# Patient Record
Sex: Male | Born: 1950
Health system: Southern US, Community
[De-identification: ages and names within clinical notes are randomized; demographics above are authoritative.]

## PROBLEM LIST (undated history)

## (undated) ENCOUNTER — Emergency Department (HOSPITAL_COMMUNITY): Payer: BC Managed Care – PPO

## (undated) DIAGNOSIS — R059 Cough, unspecified: Secondary | ICD-10-CM

## (undated) DIAGNOSIS — J302 Other seasonal allergic rhinitis: Secondary | ICD-10-CM

## (undated) DIAGNOSIS — Z9889 Other specified postprocedural states: Secondary | ICD-10-CM

## (undated) DIAGNOSIS — H269 Unspecified cataract: Secondary | ICD-10-CM

## (undated) DIAGNOSIS — R05 Cough: Secondary | ICD-10-CM

## (undated) DIAGNOSIS — M1990 Unspecified osteoarthritis, unspecified site: Secondary | ICD-10-CM

## (undated) DIAGNOSIS — M2550 Pain in unspecified joint: Secondary | ICD-10-CM

## (undated) DIAGNOSIS — I10 Essential (primary) hypertension: Secondary | ICD-10-CM

## (undated) HISTORY — PX: ESOPHAGOGASTRODUODENOSCOPY: SHX1529

## (undated) HISTORY — PX: TONSILLECTOMY: SUR1361

## (undated) HISTORY — PX: COLONOSCOPY: SHX174

---

## 1998-04-09 ENCOUNTER — Ambulatory Visit (HOSPITAL_COMMUNITY): Admission: RE | Admit: 1998-04-09 | Discharge: 1998-04-09 | Payer: Self-pay | Admitting: Gastroenterology

## 1998-08-28 ENCOUNTER — Encounter: Admission: RE | Admit: 1998-08-28 | Discharge: 1998-11-26 | Payer: Self-pay | Admitting: Internal Medicine

## 2001-08-26 ENCOUNTER — Ambulatory Visit (HOSPITAL_COMMUNITY): Admission: RE | Admit: 2001-08-26 | Discharge: 2001-08-26 | Payer: Self-pay | Admitting: Gastroenterology

## 2001-08-26 ENCOUNTER — Encounter (INDEPENDENT_AMBULATORY_CARE_PROVIDER_SITE_OTHER): Payer: Self-pay | Admitting: Specialist

## 2009-07-11 ENCOUNTER — Ambulatory Visit (HOSPITAL_COMMUNITY): Admission: RE | Admit: 2009-07-11 | Discharge: 2009-07-11 | Payer: Self-pay | Admitting: Internal Medicine

## 2010-06-20 ENCOUNTER — Ambulatory Visit: Payer: BC Managed Care – PPO | Attending: Orthopedic Surgery

## 2010-06-20 DIAGNOSIS — M25519 Pain in unspecified shoulder: Secondary | ICD-10-CM | POA: Insufficient documentation

## 2010-06-20 DIAGNOSIS — R5381 Other malaise: Secondary | ICD-10-CM | POA: Insufficient documentation

## 2010-06-20 DIAGNOSIS — IMO0001 Reserved for inherently not codable concepts without codable children: Secondary | ICD-10-CM | POA: Insufficient documentation

## 2010-06-20 DIAGNOSIS — M25619 Stiffness of unspecified shoulder, not elsewhere classified: Secondary | ICD-10-CM | POA: Insufficient documentation

## 2010-06-22 ENCOUNTER — Encounter: Payer: Self-pay | Admitting: *Deleted

## 2010-07-03 ENCOUNTER — Ambulatory Visit: Payer: BC Managed Care – PPO

## 2010-07-10 ENCOUNTER — Ambulatory Visit: Payer: BC Managed Care – PPO

## 2010-07-24 ENCOUNTER — Ambulatory Visit: Payer: BC Managed Care – PPO

## 2010-08-02 NOTE — Procedures (Signed)
[place]. [facility]  Patient:    Kevin Reynolds, Kevin Reynolds Visit Number: [id number] MRN: [id number]          Service Type: END Location: ENDO Attending Physician:  Rich Brave Dictated by:   Florencia Reasons, M.D. Proc. Date: 08/26/01 Admit Date:  08/26/2001 Discharge Date: 08/26/2001   CC:         Lindell Spar. Chestine Spore, M.D.   Procedure Report  PROCEDURE:  Colonoscopy with biopsy.  INDICATION:  A 60 year old gentleman with prior history of colonic adenomas, removed approximately seven years ago and again about 3-1/2 years ago.  FINDINGS:  Diminutive polyp in the distal rectum.  DESCRIPTION OF PROCEDURE:  The nature, purpose, and risks of the procedure were familiar to the patient from prior exams, and he provided written consent.  Sedation was fentanyl 100 mcg and Versed 8 mg IV without arrhythmias or desaturation.  Due to the patients stocky, muscular body habitus, even with sedation it was difficult to get a good perianal or digital exam of the prostate, but the small portion of the prostate gland I was able to feel felt normal.  The Olympus video adult colonoscope was advanced to the cecum without difficulty, and pullback was then performed.  The quality of the prep was excellent, and it was felt that all areas were well-seen.  The only abnormality on this exam was a 2-3 mm sessile polyp in the distal rectum just proximal to the dentate line, observed on retroflex viewing and removed by a couple of cold biopsies.  No large polyps, cancer, colitis, vascular malformations, or diverticular disease were observed.  The patient tolerated the procedure well, and there were no apparent complications.  IMPRESSION:  Normal exam except for diminutive distal rectal polyp.  PLAN:  Await pathology.  Anticipate colonoscopic follow-up in five years. Dictated by:   Florencia Reasons, M.D. Attending Physician:  Rich Brave DD:   08/26/01 TD:  08/28/01 Job: [id number]

## 2012-06-04 ENCOUNTER — Other Ambulatory Visit: Payer: Self-pay | Admitting: Ophthalmology

## 2012-06-04 NOTE — H&P (Signed)
  Pre-operative History and Physical for Ophthalmic Surgery  Kevin Reynolds 06/04/2012                  Chief Complaint:  Decreased vision and glare for driving OS  Diagnosis: Combined Cataract OS  Allergies not on file   Prior to Admission medications   Not on File    Planned Procedure:                                       Phacoemulsification, Posterior Chamber Intra-ocular Lens Left Eye                                       Acrysof MA50BM + 10.00  Diopter PC IOL for implant OS   There were no vitals filed for this visit.  Pulse: 68         Temp: NE        Resp:  16       ROS: negative  No past medical history on file.  No past surgical history on file.   History   Social History  . Marital Status: Married    Spouse Name: N/A    Number of Children: N/A  . Years of Education: N/A   Occupational History  . Not on file.   Social History Main Topics  . Smoking status: Not on file  . Smokeless tobacco: Not on file  . Alcohol Use: Not on file  . Drug Use: Not on file  . Sexually Active: Not on file   Other Topics Concern  . Not on file   Social History Narrative  . No narrative on file     The following examination is for anesthesia clearance for minimally invasive Ophthalmic surgery. It is primarily to document heart and lung findings and is not intended to elucidate unknown general medical conditions inclusive of abdominal masses, lung lesions, etc.   General Constitution:  .wml   Alertness/Orientation:  Person, time place     yes   HEENT:  Eye Findings:  Combined Cataract                   left eye  Neck: supple without masses  Chest/Lungs: clear to auscultation  Cardiac: Normal S1 and S2 without Murmur, S3 or S4  Neuro: non-focal  Impression:  Combined Cataract Left Eye  Planned Procedure:  Phacoemulsification, Posterior Chamber Intraocular Lens     Shade Flood, MD

## 2012-06-30 ENCOUNTER — Encounter (HOSPITAL_COMMUNITY): Payer: Self-pay | Admitting: Pharmacy Technician

## 2012-07-01 ENCOUNTER — Encounter (HOSPITAL_COMMUNITY)
Admission: RE | Admit: 2012-07-01 | Discharge: 2012-07-01 | Disposition: A | Discharge status: Home / Self Care | Payer: BC Managed Care – PPO | Source: Ambulatory Visit | Attending: Ophthalmology | Admitting: Ophthalmology

## 2012-07-01 ENCOUNTER — Encounter (HOSPITAL_COMMUNITY)
Admission: RE | Admit: 2012-07-01 | Discharge: 2012-07-01 | Disposition: A | Discharge status: Home / Self Care | Payer: BC Managed Care – PPO | Source: Ambulatory Visit | Attending: Anesthesiology | Admitting: Anesthesiology

## 2012-07-01 ENCOUNTER — Encounter (HOSPITAL_COMMUNITY): Payer: Self-pay

## 2012-07-01 DIAGNOSIS — Z01818 Encounter for other preprocedural examination: Secondary | ICD-10-CM | POA: Insufficient documentation

## 2012-07-01 DIAGNOSIS — H269 Unspecified cataract: Secondary | ICD-10-CM | POA: Insufficient documentation

## 2012-07-01 DIAGNOSIS — J309 Allergic rhinitis, unspecified: Secondary | ICD-10-CM | POA: Insufficient documentation

## 2012-07-01 DIAGNOSIS — I10 Essential (primary) hypertension: Secondary | ICD-10-CM | POA: Insufficient documentation

## 2012-07-01 DIAGNOSIS — M129 Arthropathy, unspecified: Secondary | ICD-10-CM | POA: Insufficient documentation

## 2012-07-01 DIAGNOSIS — Z01812 Encounter for preprocedural laboratory examination: Secondary | ICD-10-CM | POA: Insufficient documentation

## 2012-07-01 DIAGNOSIS — G4733 Obstructive sleep apnea (adult) (pediatric): Secondary | ICD-10-CM | POA: Insufficient documentation

## 2012-07-01 DIAGNOSIS — Z6841 Body Mass Index (BMI) 40.0 and over, adult: Secondary | ICD-10-CM | POA: Insufficient documentation

## 2012-07-01 DIAGNOSIS — Z87891 Personal history of nicotine dependence: Secondary | ICD-10-CM | POA: Insufficient documentation

## 2012-07-01 DIAGNOSIS — Z01810 Encounter for preprocedural cardiovascular examination: Secondary | ICD-10-CM | POA: Insufficient documentation

## 2012-07-01 DIAGNOSIS — I498 Other specified cardiac arrhythmias: Secondary | ICD-10-CM | POA: Insufficient documentation

## 2012-07-01 HISTORY — DX: Pain in unspecified joint: M25.50

## 2012-07-01 HISTORY — DX: Essential (primary) hypertension: I10

## 2012-07-01 HISTORY — DX: Other specified postprocedural states: Z98.890

## 2012-07-01 HISTORY — DX: Unspecified cataract: H26.9

## 2012-07-01 HISTORY — DX: Cough: R05

## 2012-07-01 HISTORY — DX: Cough, unspecified: R05.9

## 2012-07-01 HISTORY — DX: Other seasonal allergic rhinitis: J30.2

## 2012-07-01 HISTORY — DX: Unspecified osteoarthritis, unspecified site: M19.90

## 2012-07-01 LAB — BASIC METABOLIC PANEL
BUN: 18 mg/dL (ref 6–23)
Calcium: 9.5 mg/dL (ref 8.4–10.5)
Creatinine, Ser: 1.27 mg/dL (ref 0.50–1.35)
GFR calc Af Amer: 70 mL/min — ABNORMAL LOW (ref >90)
GFR calc non Af Amer: 60 mL/min — ABNORMAL LOW (ref >90)
Glucose, Bld: 83 mg/dL (ref 70–99)
Potassium: 3.9 mEq/L (ref 3.5–5.1)

## 2012-07-01 LAB — CBC
MCH: 30.2 pg (ref 26.0–34.0)
MCHC: 36 g/dL (ref 30.0–36.0)
Platelets: 220 10*3/uL (ref 150–400)
RDW: 13.2 % (ref 11.5–15.5)

## 2012-07-01 NOTE — Progress Notes (Signed)
Pt doesn't have a cardiologist  Denies ever having an echo/stress test /heart cath  Medical MD with Dr.Preston Chestine Spore  Denies EKG or CXR in past yr

## 2012-07-01 NOTE — Pre-Procedure Instructions (Signed)
Kevin Reynolds  07/01/2012   Your procedure is scheduled on:  Wed, April 30 @ 8:30 AM  Report to [facility] at 6:30 AM.  Call this number if you have problems the morning of surgery: [phone]   Remember:   Do not eat food or drink liquids after midnight.   Take these medicines the morning of surgery with A SIP OF WATER: Amlodipine(Norvasc),Claritin(Loratadine(,and Nasonex(Mometasone-if needed)   Do not wear jewelry  Do not wear lotions, powders, or colognes You may wear deodorant.  Men may shave face and neck.  Do not bring valuables to the hospital.  Contacts, dentures or bridgework may not be worn into surgery.  Leave suitcase in the car. After surgery it may be brought to your room.  For patients admitted to the hospital, checkout time is 11:00 AM the day of  discharge.   Patients discharged the day of surgery will not be allowed to drive  home.    Special Instructions: Shower using CHG 2 nights before surgery and the night before surgery.  If you shower the day of surgery use CHG.  Use special wash - you have one bottle of CHG for all showers.  You should use approximately 1/3 of the bottle for each shower.   Please read over the following fact sheets that you were given: Pain Booklet, Coughing and Deep Breathing and Surgical Site Infection Prevention

## 2012-07-01 NOTE — Progress Notes (Signed)
Anesthesia chart review: Patient is a 62 year old male scheduled for left eye cataract extraction by Dr. Clarisa Kindred on 07/14/2012. MAC anesthesia is anticipated.  History includes morbid obesity (BMI 40), former smoker, HTN, arthritis, allergies, tonsillectomy.  OSA screening score was a 7.  PCP is Dr. Margaretmary Bayley.  EKG on 07/01/12 showed sinus bradycardia at 51 bpm, cannot rule out anterior infarct, age undetermined, borderline left atrial hypertrophy. No comparison EKGs are currently available.  No CV symptoms were documented at his PAT visit.  He has no known MI/CHF or DM history.  He denied prior cardiac studies such as stress, echo, or cath.  CXR on 07/01/12 showed no active disease.  Preoperative labs noted.  Patient will be evaluated by his assigned anesthesiologist on the day of surgery, but if he remains asymptomatic from a CV standpoint then would anticipate he could proceed as planned. His OSA screening score was elevated, so OSA precautions will need to be considered in the post-operative period.  Velna Ochs [facility]/Anesthesiology Phone [phone] 07/01/2012 2:22 PM

## 2012-07-01 NOTE — Progress Notes (Signed)
07/01/12 0926  OBSTRUCTIVE SLEEP APNEA  Have you ever been diagnosed with sleep apnea through a sleep study? No  Do you snore loudly (loud enough to be heard through closed doors)?  1  Do you often feel tired, fatigued, or sleepy during the daytime? 0  Has anyone observed you stop breathing during your sleep? 1  Do you have, or are you being treated for high blood pressure? 1  BMI more than 35 kg/m2? 1  Age over 60 years old? 1  Neck circumference greater than 40 cm/18 inches? 1 (18 1/2)  Gender: 1  Obstructive Sleep Apnea Score 7  Score 4 or greater  Results sent to PCP

## 2012-07-13 MED ORDER — GATIFLOXACIN 0.5 % OP SOLN
1.0000 [drp] | OPHTHALMIC | Status: AC | PRN
  Administered 2012-07-14 (×3): 1 [drp] via OPHTHALMIC
  Filled 2012-07-13: qty 2.5

## 2012-07-13 MED ORDER — PHENYLEPHRINE HCL 2.5 % OP SOLN
1.0000 [drp] | OPHTHALMIC | Status: AC | PRN
  Administered 2012-07-14 (×3): 1 [drp] via OPHTHALMIC
  Filled 2012-07-13: qty 3

## 2012-07-13 MED ORDER — TETRACAINE HCL 0.5 % OP SOLN
2.0000 [drp] | OPHTHALMIC | Status: AC
  Administered 2012-07-14: 2 [drp] via OPHTHALMIC
  Filled 2012-07-13: qty 2

## 2012-07-13 MED ORDER — PREDNISOLONE ACETATE 1 % OP SUSP
1.0000 [drp] | OPHTHALMIC | Status: AC
  Administered 2012-07-14: 1 [drp] via OPHTHALMIC
  Filled 2012-07-13: qty 5

## 2012-07-14 ENCOUNTER — Encounter (HOSPITAL_COMMUNITY): Payer: Self-pay | Admitting: Anesthesiology

## 2012-07-14 ENCOUNTER — Ambulatory Visit (HOSPITAL_COMMUNITY): Payer: BC Managed Care – PPO | Admitting: Anesthesiology

## 2012-07-14 ENCOUNTER — Encounter (HOSPITAL_COMMUNITY): Payer: Self-pay | Admitting: Vascular Surgery

## 2012-07-14 ENCOUNTER — Ambulatory Visit (HOSPITAL_COMMUNITY)
Admission: RE | Admit: 2012-07-14 | Discharge: 2012-07-14 | Disposition: A | Discharge status: Home / Self Care | Payer: BC Managed Care – PPO | Source: Ambulatory Visit | Attending: Ophthalmology | Admitting: Ophthalmology

## 2012-07-14 ENCOUNTER — Encounter (HOSPITAL_COMMUNITY)
Admission: RE | Disposition: A | Discharge status: Home / Self Care | Payer: Self-pay | Source: Ambulatory Visit | Attending: Ophthalmology

## 2012-07-14 DIAGNOSIS — H2510 Age-related nuclear cataract, unspecified eye: Secondary | ICD-10-CM | POA: Insufficient documentation

## 2012-07-14 DIAGNOSIS — I10 Essential (primary) hypertension: Secondary | ICD-10-CM | POA: Insufficient documentation

## 2012-07-14 HISTORY — PX: CATARACT EXTRACTION W/PHACO: SHX586

## 2012-07-14 SURGERY — PHACOEMULSIFICATION, CATARACT, WITH IOL INSERTION
Anesthesia: Monitor Anesthesia Care | Site: Eye | Laterality: Left | Wound class: Clean

## 2012-07-14 MED ORDER — FENTANYL CITRATE 0.05 MG/ML IJ SOLN
INTRAMUSCULAR | Status: DC | PRN
  Administered 2012-07-14: 50 ug via INTRAVENOUS
  Administered 2012-07-14: 25 ug via INTRAVENOUS
  Administered 2012-07-14: 50 ug via INTRAVENOUS

## 2012-07-14 MED ORDER — HYPROMELLOSE (GONIOSCOPIC) 2.5 % OP SOLN
OPHTHALMIC | Status: DC | PRN
  Administered 2012-07-14: 2 [drp] via OPHTHALMIC

## 2012-07-14 MED ORDER — DEXAMETHASONE SODIUM PHOSPHATE 10 MG/ML IJ SOLN
INTRAMUSCULAR | Status: AC
  Filled 2012-07-14: qty 1

## 2012-07-14 MED ORDER — BSS IO SOLN
INTRAOCULAR | Status: DC | PRN
  Administered 2012-07-14: 15 mL via INTRAOCULAR

## 2012-07-14 MED ORDER — BUPIVACAINE HCL (PF) 0.75 % IJ SOLN
INTRAMUSCULAR | Status: AC
  Filled 2012-07-14: qty 10

## 2012-07-14 MED ORDER — ACETAZOLAMIDE SODIUM 500 MG IJ SOLR
INTRAMUSCULAR | Status: AC
  Filled 2012-07-14: qty 500

## 2012-07-14 MED ORDER — EPINEPHRINE HCL 1 MG/ML IJ SOLN
INTRAOCULAR | Status: DC | PRN
  Administered 2012-07-14: 09:00:00

## 2012-07-14 MED ORDER — LIDOCAINE HCL (CARDIAC) 20 MG/ML IV SOLN
INTRAVENOUS | Status: DC | PRN
  Administered 2012-07-14: 20 mg via INTRAVENOUS

## 2012-07-14 MED ORDER — SODIUM CHLORIDE 0.9 % IV SOLN
INTRAVENOUS | Status: DC | PRN
  Administered 2012-07-14: 08:00:00 via INTRAVENOUS

## 2012-07-14 MED ORDER — CEFAZOLIN SUBCONJUNCTIVAL INJECTION 100 MG/0.5 ML
INJECTION | SUBCONJUNCTIVAL | Status: DC | PRN
  Administered 2012-07-14: 100 mg via SUBCONJUNCTIVAL

## 2012-07-14 MED ORDER — PROPOFOL 10 MG/ML IV BOLUS
INTRAVENOUS | Status: DC | PRN
  Administered 2012-07-14: 40 mg via INTRAVENOUS

## 2012-07-14 MED ORDER — NA CHONDROIT SULF-NA HYALURON 40-30 MG/ML IO SOLN
INTRAOCULAR | Status: DC | PRN
  Administered 2012-07-14: 0.5 mL via INTRAOCULAR

## 2012-07-14 MED ORDER — LIDOCAINE HCL 2 % IJ SOLN
INTRAMUSCULAR | Status: DC | PRN
  Administered 2012-07-14: 09:00:00 via RETROBULBAR

## 2012-07-14 MED ORDER — BACITRACIN-POLYMYXIN B 500-10000 UNIT/GM OP OINT
TOPICAL_OINTMENT | OPHTHALMIC | Status: AC
  Filled 2012-07-14: qty 3.5

## 2012-07-14 MED ORDER — DEXAMETHASONE SODIUM PHOSPHATE 10 MG/ML IJ SOLN
INTRAMUSCULAR | Status: DC | PRN
  Administered 2012-07-14: 10 mg

## 2012-07-14 MED ORDER — NA CHONDROIT SULF-NA HYALURON 40-30 MG/ML IO SOLN
INTRAOCULAR | Status: AC
  Filled 2012-07-14: qty 0.5

## 2012-07-14 MED ORDER — CEFAZOLIN SUBCONJUNCTIVAL INJECTION 100 MG/0.5 ML
200.0000 mg | INJECTION | Freq: Once | SUBCONJUNCTIVAL | Status: DC
  Filled 2012-07-14: qty 1

## 2012-07-14 MED ORDER — LACTATED RINGERS IV SOLN: INTRAVENOUS | Status: DC | PRN

## 2012-07-14 MED ORDER — LIDOCAINE HCL 2 % IJ SOLN
INTRAMUSCULAR | Status: AC
  Filled 2012-07-14: qty 20

## 2012-07-14 MED ORDER — TRIAMCINOLONE ACETONIDE 40 MG/ML IJ SUSP
INTRAMUSCULAR | Status: AC
  Filled 2012-07-14: qty 5

## 2012-07-14 MED ORDER — HYPROMELLOSE (GONIOSCOPIC) 2.5 % OP SOLN
OPHTHALMIC | Status: AC
  Filled 2012-07-14: qty 15

## 2012-07-14 MED ORDER — BACITRACIN-POLYMYXIN B 500-10000 UNIT/GM OP OINT
TOPICAL_OINTMENT | OPHTHALMIC | Status: DC | PRN
  Administered 2012-07-14: 1 via OPHTHALMIC

## 2012-07-14 MED ORDER — SODIUM HYALURONATE 10 MG/ML IO SOLN
INTRAOCULAR | Status: AC
  Filled 2012-07-14: qty 0.85

## 2012-07-14 MED ORDER — SODIUM HYALURONATE 10 MG/ML IO SOLN
INTRAOCULAR | Status: DC | PRN
  Administered 2012-07-14: 0.85 mL via INTRAOCULAR

## 2012-07-14 MED ORDER — EPINEPHRINE HCL 1 MG/ML IJ SOLN
INTRAMUSCULAR | Status: AC
  Filled 2012-07-14: qty 1

## 2012-07-14 MED ORDER — ONDANSETRON HCL 4 MG/2ML IJ SOLN
INTRAMUSCULAR | Status: DC | PRN
  Administered 2012-07-14: 4 mg via INTRAVENOUS

## 2012-07-14 MED ORDER — GLYCOPYRROLATE 0.2 MG/ML IJ SOLN
INTRAMUSCULAR | Status: DC | PRN
  Administered 2012-07-14: 0.2 mg via INTRAVENOUS

## 2012-07-14 MED ORDER — TETRACAINE HCL 0.5 % OP SOLN
OPHTHALMIC | Status: AC
  Filled 2012-07-14: qty 2

## 2012-07-14 MED ORDER — MIDAZOLAM HCL 5 MG/5ML IJ SOLN
INTRAMUSCULAR | Status: DC | PRN
  Administered 2012-07-14: 2 mg via INTRAVENOUS

## 2012-07-14 SURGICAL SUPPLY — 59 items
APL SRG 3 HI ABS STRL LF PLS (MISCELLANEOUS) ×1
APPLICATOR COTTON TIP 6IN STRL (MISCELLANEOUS) ×2 IMPLANT
APPLICATOR DR MATTHEWS STRL (MISCELLANEOUS) ×2 IMPLANT
BAG MINI COLL DRAIN (WOUND CARE) ×2 IMPLANT
BLADE EYE MINI 60D BEAVER (BLADE) IMPLANT
BLADE KERATOME 2.75 (BLADE) ×2 IMPLANT
BLADE STAB KNIFE 15DEG (BLADE) IMPLANT
CANNULA ANTERIOR CHAMBER 27GA (MISCELLANEOUS) IMPLANT
CLOTH BEACON ORANGE TIMEOUT ST (SAFETY) ×2 IMPLANT
DRAPE OPHTHALMIC 77X100 STRL (CUSTOM PROCEDURE TRAY) ×2 IMPLANT
DRAPE POUCH INSTRU U-SHP 10X18 (DRAPES) ×2 IMPLANT
DRSG TEGADERM 4X4.75 (GAUZE/BANDAGES/DRESSINGS) ×2 IMPLANT
FILTER BLUE MILLIPORE (MISCELLANEOUS) IMPLANT
GLOVE SS BIOGEL STRL SZ 6.5 (GLOVE) ×1 IMPLANT
GLOVE SUPERSENSE BIOGEL SZ 6.5 (GLOVE) ×1
GOWN SRG XL XLNG 56XLVL 4 (GOWN DISPOSABLE) ×1 IMPLANT
GOWN STRL NON-REIN LRG LVL3 (GOWN DISPOSABLE) ×2 IMPLANT
GOWN STRL NON-REIN XL XLG LVL4 (GOWN DISPOSABLE) ×2
KIT BASIN OR (CUSTOM PROCEDURE TRAY) ×2 IMPLANT
KIT ROOM TURNOVER OR (KITS) IMPLANT
KNIFE GRIESHABER SHARP 2.5MM (MISCELLANEOUS) ×2 IMPLANT
LENS IOL ACRYSOF MP POST 19.0 (Intraocular Lens) ×2 IMPLANT
MASK EYE SHIELD (GAUZE/BANDAGES/DRESSINGS) ×2 IMPLANT
NEEDLE 18GX1X1/2 (RX/OR ONLY) (NEEDLE) IMPLANT
NEEDLE 22X1 1/2 (OR ONLY) (NEEDLE) ×2 IMPLANT
NEEDLE 25GX 5/8IN NON SAFETY (NEEDLE) ×2 IMPLANT
NEEDLE FILTER BLUNT 18X 1/2SAF (NEEDLE)
NEEDLE FILTER BLUNT 18X1 1/2 (NEEDLE) IMPLANT
NEEDLE HYPO 30X.5 LL (NEEDLE) ×4 IMPLANT
NS IRRIG 1000ML POUR BTL (IV SOLUTION) ×2 IMPLANT
PACK CATARACT CUSTOM (CUSTOM PROCEDURE TRAY) ×2 IMPLANT
PACK CATARACT MCHSCP (PACKS) ×2 IMPLANT
PACK COMBINED CATERACT/VIT 23G (OPHTHALMIC RELATED) IMPLANT
PAD ARMBOARD 7.5X6 YLW CONV (MISCELLANEOUS) ×4 IMPLANT
PAD EYE OVAL STERILE LF (GAUZE/BANDAGES/DRESSINGS) ×2 IMPLANT
PHACO TIP KELMAN 45DEG (TIP) IMPLANT
PROBE ANTERIOR 20G W/INFUS NDL (MISCELLANEOUS) IMPLANT
RING MALYGIN (MISCELLANEOUS) IMPLANT
ROLLS DENTAL (MISCELLANEOUS) IMPLANT
SHUTTLE MONARCH TYPE A (NEEDLE) ×2 IMPLANT
SOLUTION ANTI FOG 6CC (MISCELLANEOUS) IMPLANT
SPEAR EYE SURG WECK-CEL (MISCELLANEOUS) ×2 IMPLANT
SUT ETHILON 10-0 CS-B-6CS-B-6 (SUTURE)
SUT ETHILON 5 0 P 3 18 (SUTURE)
SUT ETHILON 9 0 TG140 8 (SUTURE) IMPLANT
SUT NYLON ETHILON 5-0 P-3 1X18 (SUTURE) IMPLANT
SUT PLAIN 6 0 TG1408 (SUTURE) IMPLANT
SUT POLY NON ABSORB 10-0 8 STR (SUTURE) IMPLANT
SUT VICRYL 6 0 S 29 12 (SUTURE) IMPLANT
SUTURE EHLN 10-0 CS-B-6CS-B-6 (SUTURE) IMPLANT
SYR 20CC LL (SYRINGE) IMPLANT
SYR 5ML LL (SYRINGE) IMPLANT
SYR TB 1ML LUER SLIP (SYRINGE) IMPLANT
SYRINGE 10CC LL (SYRINGE) IMPLANT
TIP ABS 45DEG FLARED 0.9MM (TIP) ×2 IMPLANT
TOWEL OR 17X24 6PK STRL BLUE (TOWEL DISPOSABLE) ×4 IMPLANT
WATER STERILE IRR 1000ML POUR (IV SOLUTION) ×2 IMPLANT
WIPE INSTRUMENT ADHESIVE BACK (MISCELLANEOUS) ×2 IMPLANT
WIPE INSTRUMENT VISIWIPE 73X73 (MISCELLANEOUS) ×2 IMPLANT

## 2012-07-14 NOTE — H&P (Signed)
Chief Complaint:   62 year old male is referred for Cataract evaluaiton. He reoprts difficulty seeing for driving,  History of Present Illness:   Has difficulty seeing to drive at night, and has difficultry seeing to drive in bright sun light .  Also thinks his peripheral vision at night.  Any pain or discomfort.  No burning or itching.  No pus or mucus.   Presents for evaluation.( Reviewed by Doctor: GG) Past History:  Allergies:  nkda, Active Medications:   Other Medications:  Norvasc daily, Motrin 800mg  TID, Claratin PRN Birth History:  none Past Ocular History:   cataract  Past Medical History:   Hypertension Past Surgical History:   tonsillectomy  Family History:  no amblyopia, no blindness, no cataracts, no crossed eyes, no diabetic retinopathy, no glaucoma, no macular degeneration, no retinal detachment, + cancer (mother), + diabetes (grandmother), no heart disease, + high blood pressure (father), no stroke Social History:   Smoking Status: never smoker  Alcohol:  +   Driving status:  driving Review of Systems:   Constitutional:  no fever, no weight loss    Eyes: + decreased vision  Ear/Nose/Throat: + sinus problems  Cardiovascular:  + high blood pressure  Respiratory:  no shortness of breath, no wheezing    Gastrointestinal:  no abdominal pain, no nausea    Genitourinary:  no blood in urine, no discomfort    Musculoskeletal:  no joint pain, no low back pain    Integumentary skin/breast:  no rashes, no skin tumors    Neurological:  no numbness, no weakness    Psychiatric:  no anxiety, no depression    Endocrine:  no heat intolerance, no thyroid problems    Hematologic/Lymphatic:  no anemia, no unusual bleeding    Allergic/Immunologic: + seasonal allergies  Examination:  Visual Acuity:   Distance VA cc:  OD: 20/30    OS: 20/40  Distance VA [place]:  OD: 20/50    OS: 20/70 IOP:  OD:  18     OS:  18    @ 09:19AM (Goldmann applanation) Manifest Refraction:    Sphere    Cyl  Axis       VA         Add       VA Prism Base R:  +0.50  -1.75   95    20/40       +2.75                      L:  +0.50  -2.25  105   20/50-       +2.75                       comments: Bifocal too high.  Nose pad adjusted.  Give:   +1.25 - 1.75 x 90             + 0.50 - 2.50 x 90   In Sunglasses                                        NO BIFOCAL  Confrontation visual field:  OU:  Normal  Motility:  OU:  Normal  Pupils:  OU:  Shape, size, direct and consensual reaction normal  Adnexa:  Preauricular LN, lacrimal drainage, lacrimal glands, orbit normal  Eyelids:  Eyelids:  normal Conjunctiva:  OU:  bulbar, palpebral normal  Cornea:  OU:  epithelium, stroma, endothelium, tear film normal  Anterior Chamber:  OU:  depth normal, no cell, no flare  Iris:  OU:  normal  Lens:  OD: 2+ nuclear sclerotic cataract,  2+ cortical cataract  OS: 3+ nuclear sclerotic cataract,  2+ cortical cataract  Vitreous:  OU:  normal  Optic Disc:  OU:  cupping: 0.2   Macula:  OU:  normal  Vessels:  OU:  normal  Periphery:  OU:  normal  Orientation to person, place and time:  Normal  Mood and affect:  Normal  The Ophtsscn AScan predicted value for an Acrysof MA60BM lens is +19.00  Impression:  366.19  Combined Cataract OU  Plan/Treatment:  Cataract: Discussed the natural history of Cataracts, their progression and current modalities of treating them. We discussed risks and benefits of surgery with illustrations.  Use of an implant lens was discussed with illustrations along with indicating that glasses will be needed post op for help with reading and making up any difference in distance power.   He indicated understanding our discussion and felt that his questions had been answered to his satisfaction.  He agrees with the treatment plan. The patient desires to proceed with Cataract surgery, Left eye.  Patient Instructions: Do not eat or drink after midnight. Do not take any diabetic medication  the morning of surgery. You may take your other medications with a small sip of water. Return to clinic:  May 1st, 2014 for post-operative follow-up  Schedule:  Phacoemulsification, Posterior Chamber Intraocular Lens x 07/14/2012   (electronically signed)

## 2012-07-14 NOTE — Anesthesia Postprocedure Evaluation (Signed)
Anesthesia Post Note  Patient: Kevin Reynolds  Procedure(s) Performed: Procedure(s) (LRB): CATARACT EXTRACTION PHACO AND INTRAOCULAR LENS PLACEMENT (IOC) (Left)  Anesthesia type: MAC  Patient location: PACU  Post pain: Pain level controlled  Post assessment: Post-op Vital signs reviewed  Last Vitals: BP 111/69  Pulse 65  Temp(Src) 36.7 C (Oral)  Resp 18  Ht 5' 8.5" (1.74 m)  Wt 268 lb 4.8 oz (121.7 kg)  BMI 40.2 kg/m2  SpO2 96%  Post vital signs: Reviewed  Level of consciousness: awake  Complications: No apparent anesthesia complications

## 2012-07-14 NOTE — Anesthesia Preprocedure Evaluation (Addendum)
Anesthesia Evaluation  Patient identified by MRN, date of birth, ID band Patient awake    Reviewed: Allergy & Precautions, H&P , NPO status , Patient's Chart, lab work & pertinent test results  Airway Mallampati: II TM Distance: >3 FB Neck ROM: Full    Dental  (+) Dental Advisory Given and Teeth Intact   Pulmonary neg pulmonary ROS,  breath sounds clear to auscultation        Cardiovascular hypertension, Pt. on medications Rhythm:Regular Rate:Normal     Neuro/Psych negative neurological ROS  negative psych ROS   GI/Hepatic negative GI ROS, Neg liver ROS,   Endo/Other  Morbid obesity  Renal/GU negative Renal ROS     Musculoskeletal negative musculoskeletal ROS (+) Arthritis -, Osteoarthritis,    Abdominal (+) + obese,   Peds  Hematology negative hematology ROS (+)   Anesthesia Other Findings   Reproductive/Obstetrics                          Anesthesia Physical Anesthesia Plan  ASA: III  Anesthesia Plan: MAC   Post-op Pain Management:    Induction: Intravenous  Airway Management Planned: Simple Face Mask and Nasal Cannula  Additional Equipment:   Intra-op Plan:   Post-operative Plan:   Informed Consent: I have reviewed the patients History and Physical, chart, labs and discussed the procedure including the risks, benefits and alternatives for the proposed anesthesia with the patient or authorized representative who has indicated his/her understanding and acceptance.   Dental advisory given  Plan Discussed with: CRNA  Anesthesia Plan Comments:         Anesthesia Quick Evaluation

## 2012-07-14 NOTE — Anesthesia Procedure Notes (Signed)
Procedure Name: MAC Date/Time: 07/14/2012 8:38 AM Performed by: Fransisca Kaufmann Pre-anesthesia Checklist: Patient identified, Emergency Drugs available, Suction available, Patient being monitored and Timeout performed Patient Re-evaluated:Patient Re-evaluated prior to inductionOxygen Delivery Method: Nasal cannula Intubation Type: IV induction Placement Confirmation: positive ETCO2

## 2012-07-14 NOTE — Op Note (Signed)
Kevin Reynolds 07/14/2012 Cataract: Combined, Nuclear  Procedure: Phacoemulsification, Posterior Chamber Intra-ocular Lens Operative Eye:  left eye  Surgeon: Shade Flood Estimated Blood Loss: minimal Specimens for Pathology:  None Complications: none  The patient was prepared and draped in the usual manner for ocular surgery on the left eye. A Cook lid speculum was placed. A peripheral clear corneal incision was made at the surgical limbus centered at the 11:00 meridian. A separate clear corneal stab incision was made with a 15 degree blade at the 2:00 meridian to permit bi-manual technique. Viscoat and  Provisc as an underlying layer next to the capsule was instilled into the anterior chamber through that incision.  A keratome was used to create a self sealing incision entering the anterior chamber at the 11:00 meridian. A capsulorhexis was performed using a bent 25g needle. The lens was hydrodissected and the nucleus was hydrodilineated using a Nichammin cannula. The Chang chopper was inserted and used to rotate the lens to insure adequate lens mobility. The phacoemulsification handpiece was inserted and a combined phaco-chop technique was employed, fracturing the lens into separate sections with subsequent removal with the phaco handpiece.   The I/A cannula was used to remove remaining lens cortex. Provisc was instilled and used to deepen the anterior chamber and posterior capsule bag. The Monarch injector was used to place a folded Acrysof MA50BM PC IOL, + 19.00  diopters, into the capsule bag. A Sinskey lens hook was used to dial in the trailing haptic.  The I/A cannula was used to remove the viscoelastic from the anterior chamber. BSS was used to bring IOP to the desired range and the wound was checked to insure it was watertight. Subconjunctival injections of Ancef 100/0.38ml and Dexamethasone 0.5 ml of a 10mg /7ml solution were placed without complication. The lid speculum and drapes were  removed and the patient's eye was patched with Polymixin/Bacitracin ophthalmic ointment. An eye shield was placed and the patient was transferred alert and conversant from the operating room to the post-operative recovery area.   Shade Flood, MD

## 2012-07-14 NOTE — Preoperative (Signed)
Beta Blockers   Reason not to administer Beta Blockers:Not Applicable 

## 2012-07-14 NOTE — Transfer of Care (Signed)
Immediate Anesthesia Transfer of Care Note  Patient: Kevin Reynolds  Procedure(s) Performed: Procedure(s): CATARACT EXTRACTION PHACO AND INTRAOCULAR LENS PLACEMENT (IOC) (Left)  Patient Location: PACU  Anesthesia Type:MAC  Level of Consciousness: awake, alert , oriented and sedated  Airway & Oxygen Therapy: Patient Spontanous Breathing and Patient connected to nasal cannula oxygen  Post-op Assessment: Report given to PACU RN, Post -op Vital signs reviewed and stable and Patient moving all extremities  Post vital signs: Reviewed and stable  Complications: No apparent anesthesia complications

## 2012-07-19 ENCOUNTER — Encounter (HOSPITAL_COMMUNITY): Payer: Self-pay | Admitting: Ophthalmology

## 2012-12-23 ENCOUNTER — Ambulatory Visit (INDEPENDENT_AMBULATORY_CARE_PROVIDER_SITE_OTHER): Payer: BC Managed Care – PPO | Admitting: Family Medicine

## 2012-12-23 DIAGNOSIS — G8929 Other chronic pain: Secondary | ICD-10-CM

## 2012-12-23 DIAGNOSIS — M542 Cervicalgia: Secondary | ICD-10-CM
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
## 2012-12-28 ENCOUNTER — Other Ambulatory Visit: Payer: Self-pay | Admitting: Gastroenterology

## 2014-03-13 ENCOUNTER — Ambulatory Visit (INDEPENDENT_AMBULATORY_CARE_PROVIDER_SITE_OTHER): Payer: BC Managed Care – PPO | Admitting: Physician Assistant

## 2014-03-13 VITALS — BP 130/70 | HR 62 | Temp 98.1°F | Resp 18 | Ht 68.5 in | Wt 276.2 lb

## 2014-03-13 DIAGNOSIS — H6123 Impacted cerumen, bilateral: Secondary | ICD-10-CM

## 2014-03-13 DIAGNOSIS — I10 Essential (primary) hypertension: Secondary | ICD-10-CM | POA: Insufficient documentation

## 2014-03-13 DIAGNOSIS — J302 Other seasonal allergic rhinitis: Secondary | ICD-10-CM | POA: Insufficient documentation

## 2014-03-13 DIAGNOSIS — H9201 Otalgia, right ear: Secondary | ICD-10-CM

## 2014-03-13 DIAGNOSIS — H66001 Acute suppurative otitis media without spontaneous rupture of ear drum, right ear: Secondary | ICD-10-CM

## 2014-03-13 DIAGNOSIS — M25512 Pain in left shoulder: Secondary | ICD-10-CM | POA: Insufficient documentation

## 2014-03-13 MED ORDER — AMOXICILLIN 875 MG PO TABS: 875.0000 mg | ORAL_TABLET | Freq: Two times a day (BID) | ORAL | Status: AC

## 2014-03-13 NOTE — Patient Instructions (Signed)
Get plenty of rest and drink at least 64 ounces of water daily. 

## 2014-03-13 NOTE — Progress Notes (Signed)
   Subjective:    Patient ID: Kevin Reynolds, male    DOB: Apr 14, 1950, 63 y.o.   MRN: [id number]   PCP: Foye Spurling, MD  Chief Complaint  Patient presents with  . Ear Pain    x4-5 days; right ear; recently has been sick; denies having fever/chills    No Known Allergies  Patient Active Problem List   Diagnosis Date Noted  . HTN (hypertension) 03/13/2014  . Seasonal allergies 03/13/2014  . Shoulder pain, left 03/13/2014    Prior to Admission medications   Medication Sig Start Date End Date Taking? Authorizing Provider  amLODipine (NORVASC) 10 MG tablet Take 10 mg by mouth daily.   Yes Historical Provider, MD  loratadine (CLARITIN) 10 MG tablet Take 10 mg by mouth daily as needed for allergies.   Yes Historical Provider, MD  mometasone (NASONEX) 50 MCG/ACT nasal spray Place 2 sprays into the nose daily.   Yes Historical Provider, MD    Medical, Surgical, Family and Social History reviewed and updated.  HPI  Presents with 4-5 days of RIGHT ear pain. No significant nasal congestion, drainage now, but has had a recent URI. RIGHT ear feels full and he notes reduced hearing compared with the LEFT. No fever, chills, GI/GU or sore throat. No cough.  Review of Systems As above.    Objective:   Physical Exam  Constitutional: He is oriented to person, place, and time. He appears well-developed and well-nourished. He is active and cooperative. No distress.  BP 130/70 mmHg  Pulse 62  Temp(Src) 98.1 F (36.7 C) (Oral)  Resp 18  Ht 5' 8.5" (1.74 m)  Wt 276 lb 3.2 oz (125.283 kg)  BMI 41.38 kg/m2  SpO2 96%   HENT:  Head: Normocephalic and atraumatic.  Right Ear: Hearing and external ear normal. Tympanic membrane is injected and bulging. Tympanic membrane is not perforated.  Left Ear: Hearing, tympanic membrane and external ear normal.  Both canals occluded with cerumen which was removed by irrigation. The RIGHT TM is injected, opaque and bulging.  Eyes: Conjunctivae are  normal.  Neck: Neck supple. No thyroid mass and no thyromegaly present.  Pulmonary/Chest: Effort normal.  Neurological: He is alert and oriented to person, place, and time.  Skin: Skin is warm and dry.  Psychiatric: He has a normal mood and affect. His speech is normal and behavior is normal.          Assessment & Plan:  1. Ear pain, right 2. Cerumen impaction, bilateral 3. Acute suppurative otitis media of right ear without spontaneous rupture of tympanic membrane, recurrence not specified Anticipatory guidance provided. Supportive care. RTC if symptoms worsen/persist. - amoxicillin (AMOXIL) 875 MG tablet; Take 1 tablet (875 mg total) by mouth 2 (two) times daily.  Dispense: 20 tablet; Refill: 0   Fara Chute, PA-C Physician Assistant-Certified [facility]

## 2017-01-30 ENCOUNTER — Other Ambulatory Visit: Payer: Self-pay

## 2017-01-30 ENCOUNTER — Emergency Department (HOSPITAL_BASED_OUTPATIENT_CLINIC_OR_DEPARTMENT_OTHER)
Admission: EM | Admit: 2017-01-30 | Discharge: 2017-01-30 | Disposition: A | Discharge status: Home / Self Care | Payer: BC Managed Care – PPO | Attending: Emergency Medicine | Admitting: Emergency Medicine

## 2017-01-30 ENCOUNTER — Encounter (HOSPITAL_BASED_OUTPATIENT_CLINIC_OR_DEPARTMENT_OTHER): Payer: Self-pay | Admitting: Emergency Medicine

## 2017-01-30 DIAGNOSIS — R110 Nausea: Secondary | ICD-10-CM | POA: Diagnosis not present

## 2017-01-30 DIAGNOSIS — Z79899 Other long term (current) drug therapy: Secondary | ICD-10-CM | POA: Insufficient documentation

## 2017-01-30 DIAGNOSIS — I10 Essential (primary) hypertension: Secondary | ICD-10-CM | POA: Insufficient documentation

## 2017-01-30 DIAGNOSIS — Z87891 Personal history of nicotine dependence: Secondary | ICD-10-CM | POA: Diagnosis not present

## 2017-01-30 DIAGNOSIS — M79605 Pain in left leg: Secondary | ICD-10-CM | POA: Diagnosis not present

## 2017-01-30 DIAGNOSIS — M5442 Lumbago with sciatica, left side: Secondary | ICD-10-CM

## 2017-01-30 DIAGNOSIS — M545 Low back pain: Secondary | ICD-10-CM | POA: Diagnosis present

## 2017-01-30 MED ORDER — CYCLOBENZAPRINE HCL 10 MG PO TABS: 10.0000 mg | ORAL_TABLET | Freq: Three times a day (TID) | ORAL | 0 refills | Status: DC | PRN

## 2017-01-30 MED ORDER — HYDROMORPHONE HCL 1 MG/ML IJ SOLN
1.0000 mg | Freq: Once | INTRAMUSCULAR | Status: AC
  Administered 2017-01-30: 1 mg via INTRAMUSCULAR
  Filled 2017-01-30: qty 1

## 2017-01-30 MED ORDER — KETOROLAC TROMETHAMINE 60 MG/2ML IM SOLN
30.0000 mg | Freq: Once | INTRAMUSCULAR | Status: AC
  Administered 2017-01-30: 30 mg via INTRAMUSCULAR
  Filled 2017-01-30: qty 2

## 2017-01-30 MED ORDER — ONDANSETRON 4 MG PO TBDP
4.0000 mg | ORAL_TABLET | Freq: Once | ORAL | Status: AC
Start: 2017-01-30 — End: 2017-01-30
  Administered 2017-01-30: 4 mg via ORAL
  Filled 2017-01-30: qty 1

## 2017-01-30 MED FILL — CYCLOBENZAPRINE 10 MG TAB: 10 | 5 days supply | Qty: 15 | Fill #0

## 2017-01-30 NOTE — ED Provider Notes (Signed)
[facility] EMERGENCY DEPARTMENT Provider Note   CSN: [id number] Arrival date & time: 01/30/17  2979     History   Chief Complaint Chief Complaint  Patient presents with  . Back Pain    HPI Kevin Reynolds is a 66 y.o. male.  HPI  66 year old male with a history of hypertension presents with left low back and left leg pain.  Started about 4 days ago but was mild.  Was just in his back.  Over the last couple days the pain is progressed and now is mostly present this morning in his left thigh.  Mostly anterior.  Does not go past his knee.  Feels mostly like a burning pain.  Has had a little bit of nausea but no abdominal pain or vomiting.  No urinary symptoms.  No bowel or bladder incontinence.  The pain was so severe he could hardly walk this morning because it hurts so much to bear weight.  No injuries to back or leg.  A few days ago was taking ibuprofen and Tylenol and this seemed to be helpful.  Has not taken anything this morning.  No significant past back problems.  No recent trauma or heavy lifting.  Past Medical History:  Diagnosis Date  . Arthritis    left shoulder  . Cataracts, bilateral   . Cough    related to allergies  . History of colonoscopy   . Hypertension    takes Amlodipine daily  . Joint pain    left  . Seasonal allergies    takes Claritin daily and Nasonex daily    Patient Active Problem List   Diagnosis Date Noted  . HTN (hypertension) 03/13/2014  . Seasonal allergies 03/13/2014  . Shoulder pain, left 03/13/2014    Past Surgical History:  Procedure Laterality Date  . CATARACT EXTRACTION PHACO AND INTRAOCULAR LENS PLACEMENT ([place]) Left 07/14/2012   Performed by Adonis Brook, MD at [facility]    . ESOPHAGOGASTRODUODENOSCOPY    . TONSILLECTOMY         Home Medications    Prior to Admission medications   Medication Sig Start Date End Date Taking? Authorizing Provider  ergocalciferol (VITAMIN D2) 50000 units capsule Take  50,000 Units once a week by mouth.   Yes [provider]  amLODipine (NORVASC) 10 MG tablet Take 10 mg by mouth daily.    [provider]  cyclobenzaprine (FLEXERIL) 10 MG tablet Take 1 tablet (10 mg total) 3 (three) times daily as needed by mouth for muscle spasms. 01/30/17   Sherwood Gambler, MD  loratadine (CLARITIN) 10 MG tablet Take 10 mg by mouth daily as needed for allergies.    [provider]  mometasone (NASONEX) 50 MCG/ACT nasal spray Place 2 sprays into the nose daily.    [provider]    Family History Family History  Problem Relation Age of Onset  . Cancer Mother        Breast  . Cancer Brother        Liver    Social History Social History   Tobacco Use  . Smoking status: Former Research scientist (life sciences)  . Smokeless tobacco: Never Used  . Tobacco comment: quit at age 33  Substance Use Topics  . Alcohol use: Yes    Comment: occasionally  . Drug use: No     Allergies   Patient has no known allergies.   Review of Systems Review of Systems  Gastrointestinal: Positive for nausea. Negative for abdominal pain  and vomiting.  Musculoskeletal: Positive for back pain and myalgias (left thigh).  Neurological: Negative for weakness and numbness.  All other systems reviewed and are negative.    Physical Exam Updated Vital Signs BP 119/65 (BP Location: Right Arm)   Pulse (!) 59   Temp 97.6 F (36.4 C) (Oral)   Resp 16   Ht 5\' 8"  (1.727 m)   Wt 124.7 kg (275 lb)   SpO2 95%   BMI 41.81 kg/m   Physical Exam  Constitutional: He is oriented to person, place, and time. He appears well-developed and well-nourished.  obese  HENT:  Head: Normocephalic and atraumatic.  Right Ear: External ear normal.  Left Ear: External ear normal.  Nose: Nose normal.  Eyes: Right eye exhibits no discharge. Left eye exhibits no discharge.  Neck: Neck supple.  Cardiovascular: Normal rate, regular rhythm and normal heart sounds.  Pulmonary/Chest: Effort normal  and breath sounds normal.  Abdominal: Soft. He exhibits no distension. There is no tenderness.  Musculoskeletal: He exhibits no edema.       Thoracic back: He exhibits no tenderness and no bony tenderness.       Lumbar back: He exhibits no tenderness and no bony tenderness.       Left upper leg: He exhibits no tenderness, no bony tenderness and no swelling.  Neurological: He is alert and oriented to person, place, and time.  5/5 strength in BLE, normal gross sensation. When getting up to walk he has a significant amount of pain and can barely put foot all the way to ground to walk  Skin: Skin is warm and dry.  Nursing note and vitals reviewed.    ED Treatments / Results  Labs (all labs ordered are listed, but only abnormal results are displayed) Labs Reviewed - No data to display  EKG  EKG Interpretation None       Radiology No results found.  Procedures Procedures (including critical care time)  Medications Ordered in ED Medications  ketorolac (TORADOL) injection 30 mg (30 mg Intramuscular Given 01/30/17 0920)  HYDROmorphone (DILAUDID) injection 1 mg (1 mg Intramuscular Given 01/30/17 0920)  ondansetron (ZOFRAN-ODT) disintegrating tablet 4 mg (4 mg Oral Given 01/30/17 0920)     Initial Impression / Assessment and Plan / ED Course  I have reviewed the triage vital signs and the nursing notes.  Pertinent labs & imaging results that were available during my care of the patient were reviewed by me and considered in my medical decision making (see chart for details).     Patient's presentation is consistent with sciatica.  He is feeling much better and now his pain is only down to a 1 out of 10 after IM treatment in the ED.  I discussed back exercises and trying to contact his PCP for outpatient follow-up and likely physical therapy.  Continue NSAIDs, Tylenol, add a muscle relaxer.  Apply local heat.  No acute neurologic dysfunction to be concern for a cauda equina or acute  spinal cord emergency.  No red flags.  Appears well and stable for discharge with outpatient follow-up.  Discussed return precautions.  Final Clinical Impressions(s) / ED Diagnoses   Final diagnoses:  Acute left-sided low back pain with left-sided sciatica    ED Discharge Orders        Ordered    cyclobenzaprine (FLEXERIL) 10 MG tablet  3 times daily PRN     01/30/17 1018       Sherwood Gambler, MD 01/30/17 1107

## 2017-01-30 NOTE — ED Triage Notes (Signed)
Patient reports left lower back pain which began on Monday.  Reports that the pain is now radiating down his right leg.  Denies injury.  Denies hematuria, loss of bowel or bladder control.

## 2017-02-11 ENCOUNTER — Other Ambulatory Visit: Payer: Self-pay | Admitting: Internal Medicine

## 2017-02-11 DIAGNOSIS — M5432 Sciatica, left side: Secondary | ICD-10-CM

## 2017-02-18 ENCOUNTER — Ambulatory Visit (HOSPITAL_COMMUNITY)
Admission: RE | Admit: 2017-02-18 | Discharge: 2017-02-18 | Disposition: A | Discharge status: Home / Self Care | Payer: BC Managed Care – PPO | Source: Ambulatory Visit | Attending: Internal Medicine | Admitting: Internal Medicine

## 2017-02-18 DIAGNOSIS — Q7649 Other congenital malformations of spine, not associated with scoliosis: Secondary | ICD-10-CM | POA: Insufficient documentation

## 2017-02-18 DIAGNOSIS — M5432 Sciatica, left side: Secondary | ICD-10-CM | POA: Insufficient documentation

## 2017-02-18 DIAGNOSIS — M47816 Spondylosis without myelopathy or radiculopathy, lumbar region: Secondary | ICD-10-CM | POA: Diagnosis not present

## 2017-02-18 DIAGNOSIS — M47817 Spondylosis without myelopathy or radiculopathy, lumbosacral region: Secondary | ICD-10-CM | POA: Insufficient documentation

## 2017-02-18 LAB — CREATININE, SERUM
Creatinine, Ser: 1.23 mg/dL (ref 0.61–1.24)
GFR calc Af Amer: >60 mL/min (ref >60)
GFR calc non Af Amer: >60 mL/min (ref >60)

## 2017-02-18 MED ORDER — GADOBENATE DIMEGLUMINE 529 MG/ML IV SOLN
20.0000 mL | Freq: Once | INTRAVENOUS | Status: AC | PRN
  Administered 2017-02-18: 20 mL via INTRAVENOUS

## 2017-05-07 DIAGNOSIS — H04123 Dry eye syndrome of bilateral lacrimal glands: Secondary | ICD-10-CM | POA: Diagnosis not present

## 2017-05-07 DIAGNOSIS — H25811 Combined forms of age-related cataract, right eye: Secondary | ICD-10-CM | POA: Diagnosis not present

## 2017-05-07 DIAGNOSIS — H4321 Crystalline deposits in vitreous body, right eye: Secondary | ICD-10-CM | POA: Diagnosis not present

## 2017-05-07 DIAGNOSIS — H43812 Vitreous degeneration, left eye: Secondary | ICD-10-CM | POA: Diagnosis not present

## 2017-08-27 DIAGNOSIS — R7301 Impaired fasting glucose: Secondary | ICD-10-CM | POA: Diagnosis not present

## 2017-08-27 DIAGNOSIS — Z0000 Encounter for general adult medical examination without abnormal findings: Secondary | ICD-10-CM | POA: Diagnosis not present

## 2017-08-27 DIAGNOSIS — E7800 Pure hypercholesterolemia, unspecified: Secondary | ICD-10-CM | POA: Diagnosis not present

## 2017-08-27 DIAGNOSIS — Z6841 Body Mass Index (BMI) 40.0 and over, adult: Secondary | ICD-10-CM | POA: Diagnosis not present

## 2017-08-27 DIAGNOSIS — I10 Essential (primary) hypertension: Secondary | ICD-10-CM | POA: Diagnosis not present

## 2017-08-27 DIAGNOSIS — Z125 Encounter for screening for malignant neoplasm of prostate: Secondary | ICD-10-CM | POA: Diagnosis not present

## 2017-08-27 DIAGNOSIS — Z23 Encounter for immunization: Secondary | ICD-10-CM | POA: Diagnosis not present

## 2017-10-08 DIAGNOSIS — H25811 Combined forms of age-related cataract, right eye: Secondary | ICD-10-CM | POA: Diagnosis not present

## 2017-10-08 DIAGNOSIS — H4321 Crystalline deposits in vitreous body, right eye: Secondary | ICD-10-CM | POA: Diagnosis not present

## 2017-10-08 DIAGNOSIS — H43812 Vitreous degeneration, left eye: Secondary | ICD-10-CM | POA: Diagnosis not present

## 2017-10-08 DIAGNOSIS — H04123 Dry eye syndrome of bilateral lacrimal glands: Secondary | ICD-10-CM | POA: Diagnosis not present

## 2017-11-02 DIAGNOSIS — H25811 Combined forms of age-related cataract, right eye: Secondary | ICD-10-CM | POA: Diagnosis not present

## 2017-11-17 DIAGNOSIS — H5211 Myopia, right eye: Secondary | ICD-10-CM | POA: Diagnosis not present

## 2017-11-17 DIAGNOSIS — H5202 Hypermetropia, left eye: Secondary | ICD-10-CM | POA: Diagnosis not present

## 2017-11-17 DIAGNOSIS — H52223 Regular astigmatism, bilateral: Secondary | ICD-10-CM | POA: Diagnosis not present

## 2017-11-17 DIAGNOSIS — Z961 Presence of intraocular lens: Secondary | ICD-10-CM | POA: Diagnosis not present

## 2017-12-04 DIAGNOSIS — Z86010 Personal history of colonic polyps: Secondary | ICD-10-CM | POA: Diagnosis not present

## 2018-01-12 DIAGNOSIS — Z961 Presence of intraocular lens: Secondary | ICD-10-CM | POA: Diagnosis not present

## 2018-01-12 DIAGNOSIS — H5202 Hypermetropia, left eye: Secondary | ICD-10-CM | POA: Diagnosis not present

## 2018-01-12 DIAGNOSIS — H52223 Regular astigmatism, bilateral: Secondary | ICD-10-CM | POA: Diagnosis not present

## 2018-01-12 DIAGNOSIS — H5211 Myopia, right eye: Secondary | ICD-10-CM | POA: Diagnosis not present

## 2018-08-02 DIAGNOSIS — L03119 Cellulitis of unspecified part of limb: Secondary | ICD-10-CM | POA: Diagnosis not present

## 2018-08-02 DIAGNOSIS — R21 Rash and other nonspecific skin eruption: Secondary | ICD-10-CM | POA: Diagnosis not present

## 2018-08-07 DIAGNOSIS — L237 Allergic contact dermatitis due to plants, except food: Secondary | ICD-10-CM | POA: Diagnosis not present

## 2018-08-30 DIAGNOSIS — Z1389 Encounter for screening for other disorder: Secondary | ICD-10-CM | POA: Diagnosis not present

## 2018-08-30 DIAGNOSIS — Z0000 Encounter for general adult medical examination without abnormal findings: Secondary | ICD-10-CM | POA: Diagnosis not present

## 2018-08-30 DIAGNOSIS — I10 Essential (primary) hypertension: Secondary | ICD-10-CM | POA: Diagnosis not present

## 2018-08-30 DIAGNOSIS — E7800 Pure hypercholesterolemia, unspecified: Secondary | ICD-10-CM | POA: Diagnosis not present

## 2018-08-30 DIAGNOSIS — L299 Pruritus, unspecified: Secondary | ICD-10-CM | POA: Diagnosis not present

## 2018-08-31 DIAGNOSIS — E7800 Pure hypercholesterolemia, unspecified: Secondary | ICD-10-CM | POA: Diagnosis not present

## 2018-08-31 DIAGNOSIS — I10 Essential (primary) hypertension: Secondary | ICD-10-CM | POA: Diagnosis not present

## 2018-09-14 DIAGNOSIS — L302 Cutaneous autosensitization: Secondary | ICD-10-CM | POA: Diagnosis not present

## 2018-09-14 DIAGNOSIS — I872 Venous insufficiency (chronic) (peripheral): Secondary | ICD-10-CM | POA: Diagnosis not present

## 2018-10-14 DIAGNOSIS — E7800 Pure hypercholesterolemia, unspecified: Secondary | ICD-10-CM | POA: Diagnosis not present

## 2018-10-14 DIAGNOSIS — L309 Dermatitis, unspecified: Secondary | ICD-10-CM | POA: Diagnosis not present

## 2018-11-02 DIAGNOSIS — L139 Bullous disorder, unspecified: Secondary | ICD-10-CM | POA: Diagnosis not present

## 2018-11-03 DIAGNOSIS — L309 Dermatitis, unspecified: Secondary | ICD-10-CM | POA: Diagnosis not present

## 2018-11-11 DIAGNOSIS — L989 Disorder of the skin and subcutaneous tissue, unspecified: Secondary | ICD-10-CM | POA: Diagnosis not present

## 2018-11-17 ENCOUNTER — Other Ambulatory Visit: Payer: Self-pay | Admitting: Dermatology

## 2018-11-17 DIAGNOSIS — L309 Dermatitis, unspecified: Secondary | ICD-10-CM | POA: Diagnosis not present

## 2018-11-17 DIAGNOSIS — D485 Neoplasm of uncertain behavior of skin: Secondary | ICD-10-CM | POA: Diagnosis not present

## 2019-01-13 DIAGNOSIS — Z23 Encounter for immunization: Secondary | ICD-10-CM | POA: Diagnosis not present

## 2019-02-08 DIAGNOSIS — L308 Other specified dermatitis: Secondary | ICD-10-CM | POA: Diagnosis not present

## 2019-02-08 DIAGNOSIS — L309 Dermatitis, unspecified: Secondary | ICD-10-CM | POA: Diagnosis not present

## 2019-04-21 DIAGNOSIS — Z961 Presence of intraocular lens: Secondary | ICD-10-CM | POA: Diagnosis not present

## 2019-04-21 DIAGNOSIS — H16223 Keratoconjunctivitis sicca, not specified as Sjogren's, bilateral: Secondary | ICD-10-CM | POA: Diagnosis not present

## 2019-04-21 DIAGNOSIS — H4321 Crystalline deposits in vitreous body, right eye: Secondary | ICD-10-CM | POA: Diagnosis not present

## 2019-05-24 DIAGNOSIS — H4321 Crystalline deposits in vitreous body, right eye: Secondary | ICD-10-CM | POA: Diagnosis not present

## 2019-05-24 DIAGNOSIS — H16223 Keratoconjunctivitis sicca, not specified as Sjogren's, bilateral: Secondary | ICD-10-CM | POA: Diagnosis not present

## 2019-05-24 DIAGNOSIS — Z961 Presence of intraocular lens: Secondary | ICD-10-CM | POA: Diagnosis not present

## 2019-08-24 ENCOUNTER — Emergency Department (HOSPITAL_COMMUNITY): Payer: Medicare Other

## 2019-08-24 ENCOUNTER — Inpatient Hospital Stay (HOSPITAL_COMMUNITY): Payer: Medicare Other | Admitting: Certified Registered"

## 2019-08-24 ENCOUNTER — Telehealth (HOSPITAL_COMMUNITY): Payer: Self-pay | Admitting: Emergency Medicine

## 2019-08-24 ENCOUNTER — Inpatient Hospital Stay (HOSPITAL_COMMUNITY)
Admission: EM | Admit: 2019-08-24 | Discharge: 2019-08-26 | DRG: 024 | Disposition: A | Discharge status: Home / Self Care | Payer: Medicare Other | Attending: Neurology | Admitting: Neurology

## 2019-08-24 ENCOUNTER — Inpatient Hospital Stay (HOSPITAL_COMMUNITY): Payer: Medicare Other

## 2019-08-24 ENCOUNTER — Encounter (HOSPITAL_COMMUNITY): Payer: Self-pay | Admitting: Emergency Medicine

## 2019-08-24 ENCOUNTER — Other Ambulatory Visit: Payer: Self-pay

## 2019-08-24 ENCOUNTER — Encounter (HOSPITAL_COMMUNITY)
Admission: EM | Disposition: A | Discharge status: Home / Self Care | Payer: Self-pay | Source: Home / Self Care | Attending: Neurology

## 2019-08-24 DIAGNOSIS — I6523 Occlusion and stenosis of bilateral carotid arteries: Secondary | ICD-10-CM | POA: Diagnosis not present

## 2019-08-24 DIAGNOSIS — I639 Cerebral infarction, unspecified: Secondary | ICD-10-CM

## 2019-08-24 DIAGNOSIS — Z803 Family history of malignant neoplasm of breast: Secondary | ICD-10-CM

## 2019-08-24 DIAGNOSIS — I6602 Occlusion and stenosis of left middle cerebral artery: Secondary | ICD-10-CM | POA: Diagnosis present

## 2019-08-24 DIAGNOSIS — M25512 Pain in left shoulder: Secondary | ICD-10-CM | POA: Diagnosis not present

## 2019-08-24 DIAGNOSIS — I6522 Occlusion and stenosis of left carotid artery: Secondary | ICD-10-CM | POA: Diagnosis not present

## 2019-08-24 DIAGNOSIS — I63232 Cerebral infarction due to unspecified occlusion or stenosis of left carotid arteries: Secondary | ICD-10-CM | POA: Diagnosis not present

## 2019-08-24 DIAGNOSIS — Z6841 Body Mass Index (BMI) 40.0 and over, adult: Secondary | ICD-10-CM | POA: Diagnosis not present

## 2019-08-24 DIAGNOSIS — I10 Essential (primary) hypertension: Secondary | ICD-10-CM | POA: Diagnosis not present

## 2019-08-24 DIAGNOSIS — Z87891 Personal history of nicotine dependence: Secondary | ICD-10-CM

## 2019-08-24 DIAGNOSIS — G8191 Hemiplegia, unspecified affecting right dominant side: Secondary | ICD-10-CM | POA: Diagnosis present

## 2019-08-24 DIAGNOSIS — E785 Hyperlipidemia, unspecified: Secondary | ICD-10-CM | POA: Diagnosis not present

## 2019-08-24 DIAGNOSIS — I63512 Cerebral infarction due to unspecified occlusion or stenosis of left middle cerebral artery: Secondary | ICD-10-CM | POA: Diagnosis not present

## 2019-08-24 DIAGNOSIS — R4701 Aphasia: Secondary | ICD-10-CM | POA: Diagnosis present

## 2019-08-24 DIAGNOSIS — R4781 Slurred speech: Secondary | ICD-10-CM | POA: Diagnosis present

## 2019-08-24 DIAGNOSIS — Z20822 Contact with and (suspected) exposure to covid-19: Secondary | ICD-10-CM | POA: Diagnosis not present

## 2019-08-24 DIAGNOSIS — R531 Weakness: Secondary | ICD-10-CM | POA: Diagnosis not present

## 2019-08-24 DIAGNOSIS — I63412 Cerebral infarction due to embolism of left middle cerebral artery: Secondary | ICD-10-CM | POA: Diagnosis not present

## 2019-08-24 DIAGNOSIS — J302 Other seasonal allergic rhinitis: Secondary | ICD-10-CM | POA: Diagnosis present

## 2019-08-24 DIAGNOSIS — M19012 Primary osteoarthritis, left shoulder: Secondary | ICD-10-CM | POA: Diagnosis not present

## 2019-08-24 DIAGNOSIS — Z800 Family history of malignant neoplasm of digestive organs: Secondary | ICD-10-CM

## 2019-08-24 DIAGNOSIS — G8929 Other chronic pain: Secondary | ICD-10-CM | POA: Diagnosis not present

## 2019-08-24 DIAGNOSIS — R29810 Facial weakness: Secondary | ICD-10-CM | POA: Diagnosis present

## 2019-08-24 DIAGNOSIS — Z9889 Other specified postprocedural states: Secondary | ICD-10-CM | POA: Diagnosis not present

## 2019-08-24 DIAGNOSIS — R270 Ataxia, unspecified: Secondary | ICD-10-CM | POA: Diagnosis not present

## 2019-08-24 DIAGNOSIS — I6389 Other cerebral infarction: Secondary | ICD-10-CM | POA: Diagnosis not present

## 2019-08-24 DIAGNOSIS — R410 Disorientation, unspecified: Secondary | ICD-10-CM | POA: Diagnosis not present

## 2019-08-24 DIAGNOSIS — I669 Occlusion and stenosis of unspecified cerebral artery: Secondary | ICD-10-CM | POA: Diagnosis not present

## 2019-08-24 DIAGNOSIS — R0902 Hypoxemia: Secondary | ICD-10-CM | POA: Diagnosis not present

## 2019-08-24 DIAGNOSIS — R29705 NIHSS score 5: Secondary | ICD-10-CM | POA: Diagnosis present

## 2019-08-24 HISTORY — PX: IR PERCUTANEOUS ART THROMBECTOMY/INFUSION INTRACRANIAL INC DIAG ANGIO: IMG6087

## 2019-08-24 HISTORY — PX: IR CT HEAD LTD: IMG2386

## 2019-08-24 HISTORY — PX: IR ANGIO INTRA EXTRACRAN SEL COM CAROTID INNOMINATE UNI R MOD SED: IMG5359

## 2019-08-24 HISTORY — PX: IR INTRAVSC STENT CERV CAROTID W/O EMB-PROT MOD SED INC ANGIO: IMG2304

## 2019-08-24 HISTORY — PX: IR US GUIDE VASC ACCESS RIGHT: IMG2390

## 2019-08-24 HISTORY — PX: RADIOLOGY WITH ANESTHESIA: SHX6223

## 2019-08-24 LAB — RAPID URINE DRUG SCREEN, HOSP PERFORMED
Amphetamines: NOT DETECTED
Barbiturates: NOT DETECTED
Benzodiazepines: NOT DETECTED
Cocaine: NOT DETECTED
Opiates: NOT DETECTED
Tetrahydrocannabinol: NOT DETECTED

## 2019-08-24 LAB — COMPREHENSIVE METABOLIC PANEL
ALT: 25 U/L (ref 0–44)
AST: 30 U/L (ref 15–41)
Albumin: 3.9 g/dL (ref 3.5–5.0)
Alkaline Phosphatase: 56 U/L (ref 38–126)
Anion gap: 9 (ref 5–15)
BUN: 18 mg/dL (ref 8–23)
CO2: 21 mmol/L — ABNORMAL LOW (ref 22–32)
Calcium: 9 mg/dL (ref 8.9–10.3)
Chloride: 108 mmol/L (ref 98–111)
Creatinine, Ser: 1.27 mg/dL — ABNORMAL HIGH (ref 0.61–1.24)
GFR calc Af Amer: >60 mL/min (ref >60)
GFR calc non Af Amer: 58 mL/min — ABNORMAL LOW (ref >60)
Glucose, Bld: 137 mg/dL — ABNORMAL HIGH (ref 70–99)
Potassium: 3.7 mmol/L (ref 3.5–5.1)
Sodium: 138 mmol/L (ref 135–145)
Total Bilirubin: 0.7 mg/dL (ref 0.3–1.2)
Total Protein: 6.9 g/dL (ref 6.5–8.1)

## 2019-08-24 LAB — CBC WITH DIFFERENTIAL/PLATELET
Abs Immature Granulocytes: 0.01 10*3/uL (ref 0.00–0.07)
Basophils Absolute: 0 10*3/uL (ref 0.0–0.1)
Basophils Relative: 1 %
Eosinophils Absolute: 0.1 10*3/uL (ref 0.0–0.5)
Eosinophils Relative: 1 %
HCT: 48.9 % (ref 39.0–52.0)
Hemoglobin: 16 g/dL (ref 13.0–17.0)
Immature Granulocytes: 0 %
Lymphocytes Relative: 33 %
Lymphs Abs: 1.9 10*3/uL (ref 0.7–4.0)
MCH: 29.4 pg (ref 26.0–34.0)
MCHC: 32.7 g/dL (ref 30.0–36.0)
MCV: 89.7 fL (ref 80.0–100.0)
Monocytes Absolute: 0.5 10*3/uL (ref 0.1–1.0)
Monocytes Relative: 9 %
Neutro Abs: 3.3 10*3/uL (ref 1.7–7.7)
Neutrophils Relative %: 56 %
Platelets: 193 10*3/uL (ref 150–400)
RBC: 5.45 MIL/uL (ref 4.22–5.81)
RDW: 13.6 % (ref 11.5–15.5)
WBC: 5.9 10*3/uL (ref 4.0–10.5)
nRBC: 0 % (ref 0.0–0.2)

## 2019-08-24 LAB — APTT
aPTT: 20 seconds — ABNORMAL LOW (ref 24–36)
aPTT: 24 seconds (ref 24–36)

## 2019-08-24 LAB — CBG MONITORING, ED: Glucose-Capillary: 121 mg/dL — ABNORMAL HIGH (ref 70–99)

## 2019-08-24 LAB — URINALYSIS, ROUTINE W REFLEX MICROSCOPIC
Bilirubin Urine: NEGATIVE
Glucose, UA: NEGATIVE mg/dL
Hgb urine dipstick: NEGATIVE
Ketones, ur: NEGATIVE mg/dL
Leukocytes,Ua: NEGATIVE
Nitrite: NEGATIVE
Protein, ur: NEGATIVE mg/dL
Specific Gravity, Urine: 1.024 (ref 1.005–1.030)
pH: 7 (ref 5.0–8.0)

## 2019-08-24 LAB — SARS CORONAVIRUS 2 BY RT PCR (HOSPITAL ORDER, PERFORMED IN ~~LOC~~ HOSPITAL LAB): SARS Coronavirus 2: NEGATIVE

## 2019-08-24 LAB — I-STAT CHEM 8, ED
BUN: 25 mg/dL — ABNORMAL HIGH (ref 8–23)
Calcium, Ion: 1.12 mmol/L — ABNORMAL LOW (ref 1.15–1.40)
Chloride: 103 mmol/L (ref 98–111)
Creatinine, Ser: 1.2 mg/dL (ref 0.61–1.24)
Glucose, Bld: 129 mg/dL — ABNORMAL HIGH (ref 70–99)
HCT: 49 % (ref 39.0–52.0)
Hemoglobin: 16.7 g/dL (ref 13.0–17.0)
Potassium: 4.2 mmol/L (ref 3.5–5.1)
Sodium: 140 mmol/L (ref 135–145)
TCO2: 27 mmol/L (ref 22–32)

## 2019-08-24 LAB — PROTIME-INR
INR: 1.2 (ref 0.8–1.2)
INR: 1 (ref 0.8–1.2)
Prothrombin Time: 14.6 seconds (ref 11.4–15.2)
Prothrombin Time: 13.1 seconds (ref 11.4–15.2)

## 2019-08-24 LAB — GLUCOSE, CAPILLARY
Glucose-Capillary: 164 mg/dL — ABNORMAL HIGH (ref 70–99)
Glucose-Capillary: 151 mg/dL — ABNORMAL HIGH (ref 70–99)

## 2019-08-24 LAB — MRSA PCR SCREENING: MRSA by PCR: NEGATIVE

## 2019-08-24 LAB — ETHANOL: Alcohol, Ethyl (B): <10 mg/dL (ref <10)

## 2019-08-24 LAB — HIV ANTIBODY (ROUTINE TESTING W REFLEX): HIV Screen 4th Generation wRfx: NONREACTIVE

## 2019-08-24 SURGERY — IR WITH ANESTHESIA
Anesthesia: General

## 2019-08-24 MED ORDER — TICAGRELOR 90 MG PO TABS
180.0000 mg | ORAL_TABLET | Freq: Once | ORAL | Status: AC
  Administered 2019-08-24: 180 mg
  Filled 2019-08-24: qty 2

## 2019-08-24 MED ORDER — SODIUM CHLORIDE 0.9 % IV SOLN: INTRAVENOUS | Status: DC | PRN

## 2019-08-24 MED ORDER — SODIUM CHLORIDE (PF) 0.9 % IJ SOLN
INTRAVENOUS | Status: AC | PRN
  Administered 2019-08-24 (×2): 25 ug via INTRA_ARTERIAL

## 2019-08-24 MED ORDER — ROCURONIUM 10MG/ML (10ML) SYRINGE FOR MEDFUSION PUMP - OPTIME
INTRAVENOUS | Status: DC | PRN
Start: 2019-08-24 — End: 2019-08-24
  Administered 2019-08-24: 50 mg via INTRAVENOUS
  Administered 2019-08-24 (×2): 20 mg via INTRAVENOUS

## 2019-08-24 MED ORDER — ATORVASTATIN CALCIUM 40 MG PO TABS
40.0000 mg | ORAL_TABLET | Freq: Every day | ORAL | Status: DC
  Administered 2019-08-24 – 2019-08-25 (×2): 40 mg
  Filled 2019-08-24 (×2): qty 1

## 2019-08-24 MED ORDER — FENTANYL CITRATE (PF) 100 MCG/2ML IJ SOLN
INTRAMUSCULAR | Status: DC | PRN
  Administered 2019-08-24: 100 ug via INTRAVENOUS

## 2019-08-24 MED ORDER — IOHEXOL 350 MG/ML SOLN
80.0000 mL | Freq: Once | INTRAVENOUS | Status: AC | PRN
  Administered 2019-08-24: 80 mL via INTRAVENOUS

## 2019-08-24 MED ORDER — IOHEXOL 300 MG/ML  SOLN
150.0000 mL | Freq: Once | INTRAMUSCULAR | Status: AC | PRN
  Administered 2019-08-24: 50 mL via INTRA_ARTERIAL

## 2019-08-24 MED ORDER — ACETAMINOPHEN 650 MG RE SUPP: 650.0000 mg | RECTAL | Status: DC | PRN

## 2019-08-24 MED ORDER — ACETAMINOPHEN 325 MG PO TABS: 650.0000 mg | ORAL_TABLET | ORAL | Status: DC | PRN

## 2019-08-24 MED ORDER — CHLORHEXIDINE GLUCONATE 0.12 % MT SOLN
15.0000 mL | Freq: Two times a day (BID) | OROMUCOSAL | Status: DC
  Administered 2019-08-24 – 2019-08-25 (×3): 15 mL via OROMUCOSAL
  Filled 2019-08-24 (×3): qty 15

## 2019-08-24 MED ORDER — ENOXAPARIN SODIUM 60 MG/0.6ML ~~LOC~~ SOLN
60.0000 mg | SUBCUTANEOUS | Status: DC
  Administered 2019-08-24 – 2019-08-25 (×2): 60 mg via SUBCUTANEOUS
  Filled 2019-08-24 (×3): qty 0.6

## 2019-08-24 MED ORDER — SUGAMMADEX SODIUM 200 MG/2ML IV SOLN
INTRAVENOUS | Status: DC | PRN
  Administered 2019-08-24: 400 mg via INTRAVENOUS

## 2019-08-24 MED ORDER — ASPIRIN 81 MG PO CHEW
81.0000 mg | CHEWABLE_TABLET | Freq: Every day | ORAL | Status: DC
  Filled 2019-08-24: qty 1

## 2019-08-24 MED ORDER — ACETAMINOPHEN 160 MG/5ML PO SOLN: 650.0000 mg | ORAL | Status: DC | PRN

## 2019-08-24 MED ORDER — LABETALOL HCL 5 MG/ML IV SOLN
20.0000 mg | INTRAVENOUS | Status: DC | PRN
  Administered 2019-08-24 (×2): 20 mg via INTRAVENOUS
  Filled 2019-08-24 (×2): qty 4

## 2019-08-24 MED ORDER — PHENYLEPHRINE HCL-NACL 10-0.9 MG/250ML-% IV SOLN
INTRAVENOUS | Status: DC | PRN
  Administered 2019-08-24: 50 ug/min via INTRAVENOUS

## 2019-08-24 MED ORDER — IOHEXOL 350 MG/ML SOLN
40.0000 mL | Freq: Once | INTRAVENOUS | Status: AC | PRN
  Administered 2019-08-24: 40 mL via INTRAVENOUS

## 2019-08-24 MED ORDER — SODIUM CHLORIDE 0.9 % IV SOLN: INTRAVENOUS | Status: DC

## 2019-08-24 MED ORDER — CLEVIDIPINE BUTYRATE 0.5 MG/ML IV EMUL
INTRAVENOUS | Status: AC
  Administered 2019-08-24: 2 mg/h via INTRAVENOUS
  Filled 2019-08-24: qty 50

## 2019-08-24 MED ORDER — FENTANYL CITRATE (PF) 100 MCG/2ML IJ SOLN
INTRAMUSCULAR | Status: AC
  Filled 2019-08-24: qty 2

## 2019-08-24 MED ORDER — ASPIRIN 81 MG PO CHEW
81.0000 mg | CHEWABLE_TABLET | Freq: Every day | ORAL | Status: DC
  Administered 2019-08-25 – 2019-08-26 (×2): 81 mg via ORAL
  Filled 2019-08-24 (×2): qty 1

## 2019-08-24 MED ORDER — ORAL CARE MOUTH RINSE
15.0000 mL | Freq: Two times a day (BID) | OROMUCOSAL | Status: DC
  Administered 2019-08-24 – 2019-08-25 (×4): 15 mL via OROMUCOSAL

## 2019-08-24 MED ORDER — CANGRELOR BOLUS VIA INFUSION
15.0000 ug/kg | Freq: Once | INTRAVENOUS | Status: AC
  Administered 2019-08-24: 1908 ug via INTRAVENOUS
  Filled 2019-08-24: qty 1908

## 2019-08-24 MED ORDER — PROPOFOL 10 MG/ML IV BOLUS
INTRAVENOUS | Status: DC | PRN
Start: 2019-08-24 — End: 2019-08-24
  Administered 2019-08-24: 140 mg via INTRAVENOUS
  Administered 2019-08-24: 60 mg via INTRAVENOUS

## 2019-08-24 MED ORDER — SENNOSIDES-DOCUSATE SODIUM 8.6-50 MG PO TABS: 1.0000 | ORAL_TABLET | Freq: Every evening | ORAL | Status: DC | PRN

## 2019-08-24 MED ORDER — SUCCINYLCHOLINE 20MG/ML (10ML) SYRINGE FOR MEDFUSION PUMP - OPTIME
INTRAMUSCULAR | Status: DC | PRN
Start: 2019-08-24 — End: 2019-08-24
  Administered 2019-08-24: 140 mg via INTRAVENOUS

## 2019-08-24 MED ORDER — IOHEXOL 300 MG/ML  SOLN
150.0000 mL | Freq: Once | INTRAMUSCULAR | Status: AC | PRN
  Administered 2019-08-24: 75 mL via INTRA_ARTERIAL

## 2019-08-24 MED ORDER — VERAPAMIL HCL 2.5 MG/ML IV SOLN
INTRAVENOUS | Status: AC
  Filled 2019-08-24: qty 2

## 2019-08-24 MED ORDER — CHLORHEXIDINE GLUCONATE CLOTH 2 % EX PADS
6.0000 | MEDICATED_PAD | Freq: Every day | CUTANEOUS | Status: DC
  Administered 2019-08-24 – 2019-08-25 (×2): 6 via TOPICAL

## 2019-08-24 MED ORDER — NITROGLYCERIN 1 MG/10 ML FOR IR/CATH LAB
INTRA_ARTERIAL | Status: AC
  Filled 2019-08-24: qty 10

## 2019-08-24 MED ORDER — CEFAZOLIN SODIUM-DEXTROSE 2-4 GM/100ML-% IV SOLN
INTRAVENOUS | Status: AC
  Filled 2019-08-24: qty 100

## 2019-08-24 MED ORDER — ONDANSETRON HCL 4 MG/2ML IJ SOLN
INTRAMUSCULAR | Status: DC | PRN
  Administered 2019-08-24: 4 mg via INTRAVENOUS

## 2019-08-24 MED ORDER — TICAGRELOR 90 MG PO TABS
ORAL_TABLET | ORAL | Status: AC
  Administered 2019-08-24: 90 mg
  Filled 2019-08-24: qty 2

## 2019-08-24 MED ORDER — PHENYLEPHRINE 40 MCG/ML (10ML) SYRINGE FOR IV PUSH (FOR BLOOD PRESSURE SUPPORT)
PREFILLED_SYRINGE | INTRAVENOUS | Status: DC | PRN
  Administered 2019-08-24: 40 ug via INTRAVENOUS
  Administered 2019-08-24 (×3): 80 ug via INTRAVENOUS
  Administered 2019-08-24: 40 ug via INTRAVENOUS
  Administered 2019-08-24 (×2): 80 ug via INTRAVENOUS

## 2019-08-24 MED ORDER — CLEVIDIPINE BUTYRATE 0.5 MG/ML IV EMUL
INTRAVENOUS | Status: DC | PRN
Start: 2019-08-24 — End: 2019-08-24
  Administered 2019-08-24: 2 mg/h via INTRAVENOUS

## 2019-08-24 MED ORDER — ASPIRIN 81 MG PO CHEW
CHEWABLE_TABLET | ORAL | Status: AC
  Filled 2019-08-24: qty 1

## 2019-08-24 MED ORDER — TICAGRELOR 90 MG PO TABS
90.0000 mg | ORAL_TABLET | Freq: Two times a day (BID) | ORAL | Status: DC
  Administered 2019-08-25 – 2019-08-26 (×3): 90 mg via ORAL
  Filled 2019-08-24 (×5): qty 1

## 2019-08-24 MED ORDER — STROKE: EARLY STAGES OF RECOVERY BOOK
Freq: Once | Status: AC
  Filled 2019-08-24: qty 1

## 2019-08-24 MED ORDER — CEFAZOLIN SODIUM-DEXTROSE 2-3 GM-%(50ML) IV SOLR
INTRAVENOUS | Status: DC | PRN
Start: 2019-08-24 — End: 2019-08-24
  Administered 2019-08-24: 2 g via INTRAVENOUS

## 2019-08-24 MED ORDER — ASPIRIN 300 MG RE SUPP
300.0000 mg | RECTAL | Status: AC
  Administered 2019-08-24: 300 mg via RECTAL
  Filled 2019-08-24: qty 1

## 2019-08-24 MED ORDER — CANGRELOR TETRASODIUM 50 MG IV SOLR
INTRAVENOUS | Status: AC
  Filled 2019-08-24: qty 50

## 2019-08-24 MED ORDER — HYDRALAZINE HCL 20 MG/ML IJ SOLN
20.0000 mg | Freq: Four times a day (QID) | INTRAMUSCULAR | Status: DC | PRN
  Administered 2019-08-24 – 2019-08-25 (×2): 20 mg via INTRAVENOUS
  Filled 2019-08-24 (×2): qty 1

## 2019-08-24 MED ORDER — TICAGRELOR 90 MG PO TABS
90.0000 mg | ORAL_TABLET | Freq: Two times a day (BID) | ORAL | Status: DC
  Filled 2019-08-24 (×3): qty 1

## 2019-08-24 MED ORDER — CLEVIDIPINE BUTYRATE 0.5 MG/ML IV EMUL
0.0000 mg/h | INTRAVENOUS | Status: AC
  Administered 2019-08-24: 13 mg/h via INTRAVENOUS
  Administered 2019-08-24 (×3): 21 mg/h via INTRAVENOUS
  Administered 2019-08-24: 18 mg/h via INTRAVENOUS
  Administered 2019-08-24: 21 mg/h via INTRAVENOUS
  Administered 2019-08-25: 18 mg/h via INTRAVENOUS
  Administered 2019-08-25: 17 mg/h via INTRAVENOUS
  Administered 2019-08-25: 18 mg/h via INTRAVENOUS
  Filled 2019-08-24 (×5): qty 100
  Filled 2019-08-24 (×2): qty 50
  Filled 2019-08-24 (×2): qty 100
  Filled 2019-08-24 (×2): qty 50

## 2019-08-24 MED ORDER — DEXAMETHASONE SODIUM PHOSPHATE 10 MG/ML IJ SOLN
INTRAMUSCULAR | Status: DC | PRN
Start: 2019-08-24 — End: 2019-08-24
  Administered 2019-08-24: 8 mg via INTRAVENOUS

## 2019-08-24 MED ORDER — LIDOCAINE HCL (CARDIAC) PF 100 MG/5ML IV SOSY
PREFILLED_SYRINGE | INTRAVENOUS | Status: DC | PRN
  Administered 2019-08-24: 100 mg via INTRATRACHEAL

## 2019-08-24 MED ORDER — SODIUM CHLORIDE 0.9 % IV SOLN
2.0000 ug/kg/min | INTRAVENOUS | Status: DC
  Administered 2019-08-24: 2 ug/kg/min via INTRAVENOUS
  Filled 2019-08-24 (×3): qty 50

## 2019-08-24 MED ORDER — TICAGRELOR 90 MG PO TABS: 90.0000 mg | ORAL_TABLET | Freq: Two times a day (BID) | ORAL | Status: DC

## 2019-08-24 MED ORDER — EPTIFIBATIDE 20 MG/10ML IV SOLN
INTRAVENOUS | Status: AC
  Filled 2019-08-24: qty 10

## 2019-08-24 MED ORDER — GLYCOPYRROLATE PF 0.2 MG/ML IJ SOSY
PREFILLED_SYRINGE | INTRAMUSCULAR | Status: DC | PRN
  Administered 2019-08-24: .2 mg via INTRAVENOUS

## 2019-08-24 NOTE — Progress Notes (Addendum)
Patient ID: Kevin Reynolds, male   DOB: 11/21/1950, 69 y.o.   MRN: [id number] INR  30 yb RT M LSW  11.30 pm LN  New onset RT sided weakness aphasia and dysathria. CT Brain NO ICH ASPECTS 10 CTA occluded Lt ICA and distal Lt MCA M1 with a near occlusive thrombus. CTP penumbra of 12cc  Post frontal region. Endovascular treatment D/W spouse. Procedure ,reasons,alternatives reviewed. Risks of ICH of 10 %,worsening neuro function death and inability to revascularize discussed.  Spouse expressed understanding and gave consent to proceed. S.Jerimyah Vandunk MD

## 2019-08-24 NOTE — Procedures (Signed)
Cortrak  Person Inserting Tube:  Esaw Dace, RD Tube Type:  Cortrak - 43 inches Tube Location:  Left nare Initial Placement:  Stomach Secured by: Bridle Technique Used to Measure Tube Placement:  Documented cm marking at nare/ corner of mouth Cortrak Secured At:  76 cm     Cortrak Tube Team Note:  Consult received to place a Cortrak feeding tube.   No x-ray is required. RN may begin using tube.   If the tube becomes dislodged please keep the tube and contact the Cortrak team at [email or web address] (password [email or web address]) for replacement.  If after hours and replacement cannot be delayed, place a NG tube and confirm placement with an abdominal x-ray.    Kerman Passey MS, RDN, LDN, CNSC Registered Dietitian III RD Pager Number and RD On-Call Pager Number Located in [place]

## 2019-08-24 NOTE — Transfer of Care (Signed)
Immediate Anesthesia Transfer of Care Note  Patient: Nalin Mazzocco  Procedure(s) Performed: IR WITH ANESTHESIA (N/A )  Patient Location: PACU  Anesthesia Type:General  Level of Consciousness: drowsy and patient cooperative  Airway & Oxygen Therapy: Patient Spontanous Breathing and Patient connected to face mask oxygen  Post-op Assessment: Report given to RN and Post -op Vital signs reviewed and stable  Post vital signs: Reviewed and stable  Last Vitals:  Vitals Value Taken Time  BP 104/55 08/24/19 0856  Temp    Pulse 62 08/24/19 0900  Resp 19 08/24/19 0900  SpO2 100 % 08/24/19 0900  Vitals shown include unvalidated device data.  Last Pain:  Vitals:   08/24/19 0439  TempSrc: Oral  PainSc:          Complications: No apparent anesthesia complications

## 2019-08-24 NOTE — ED Notes (Signed)
Magda Paganini CN consulted to start Foley on the ED, per her is ok to start the foley prior to go to IR. Foley placed.

## 2019-08-24 NOTE — ED Triage Notes (Signed)
Pt brought to ED by [facility] from home as a Code Stroke, last seen well 23:30 when he when to bed. Woke up around 3:45 with right arm and leg weakness, facial droop, slurred speech and very unsteady gait. BP 172/74, HR 89, R 19, SPO2 99% RA. CBG 157.

## 2019-08-24 NOTE — Sedation Documentation (Signed)
8 Fr. angioseal and quickclot to right groin

## 2019-08-24 NOTE — Progress Notes (Signed)
°  Echocardiogram 2D Echocardiogram has been attempted. Patient leaving for MRI. Will reattempt at later time.  Kevin Reynolds [name] 08/24/2019, 11:54 AM

## 2019-08-24 NOTE — ED Provider Notes (Signed)
[facility] EMERGENCY DEPARTMENT Provider Note   CSN: [id number] Arrival date & time: 08/24/19  0421     History Chief Complaint  Patient presents with  . Code Stroke    Kevin Reynolds is a 69 y.o. male.  The history is provided by the patient and medical records.   69 year old male with history of arthritis, cataracts, hypertension, seasonal allergies, chronic left shoulder pain, presenting to the ED as a code stroke.  Last known well around 11:30 PM when he went to bed.  He woke up around 3:30 AM with bright sided weakness, slurred speech, and right facial droop.  No falls or head trauma.  He is not currently anticoagulated.  No prior history of stroke.  Past Medical History:  Diagnosis Date  . Arthritis    left shoulder  . Cataracts, bilateral   . Cough    related to allergies  . History of colonoscopy   . Hypertension    takes Amlodipine daily  . Joint pain    left  . Seasonal allergies    takes Claritin daily and Nasonex daily    Patient Active Problem List   Diagnosis Date Noted  . HTN (hypertension) 03/13/2014  . Seasonal allergies 03/13/2014  . Shoulder pain, left 03/13/2014    Past Surgical History:  Procedure Laterality Date  . CATARACT EXTRACTION W/PHACO Left 07/14/2012   Procedure: CATARACT EXTRACTION PHACO AND INTRAOCULAR LENS PLACEMENT (IOC);  Surgeon: Adonis Brook, MD;  Location: [place];  Service: Ophthalmology;  Laterality: Left;  . COLONOSCOPY    . ESOPHAGOGASTRODUODENOSCOPY    . TONSILLECTOMY         Family History  Problem Relation Age of Onset  . Cancer Mother        Breast  . Cancer Brother        Liver    Social History   Tobacco Use  . Smoking status: Former Research scientist (life sciences)  . Smokeless tobacco: Never Used  . Tobacco comment: quit at age 82  Substance Use Topics  . Alcohol use: Yes    Comment: occasionally  . Drug use: No    Home Medications Prior to Admission medications   Medication Sig Start Date End Date  Taking? Authorizing Provider  amLODipine (NORVASC) 10 MG tablet Take 10 mg by mouth daily.    [provider]  cyclobenzaprine (FLEXERIL) 10 MG tablet Take 1 tablet (10 mg total) 3 (three) times daily as needed by mouth for muscle spasms. 01/30/17   Sherwood Gambler, MD  ergocalciferol (VITAMIN D2) 50000 units capsule Take 50,000 Units once a week by mouth.    [provider]  loratadine (CLARITIN) 10 MG tablet Take 10 mg by mouth daily as needed for allergies.    [provider]  mometasone (NASONEX) 50 MCG/ACT nasal spray Place 2 sprays into the nose daily.    [provider]    Allergies    Patient has no known allergies.  Review of Systems   Review of Systems  Neurological: Positive for facial asymmetry, speech difficulty and weakness.  All other systems reviewed and are negative.   Physical Exam Updated Vital Signs BP (!) 182/76 (BP Location: Left Arm)   Pulse 88   Temp 98.4 F (36.9 C) (Oral)   Resp 18   Ht 5\' 8"  (1.727 m)   Wt 127.2 kg   SpO2 97%   BMI 42.64 kg/m   Physical Exam Vitals and nursing note reviewed.  Constitutional:  Appearance: He is well-developed.  HENT:     Head: Normocephalic and atraumatic.  Eyes:     Conjunctiva/sclera: Conjunctivae normal.     Pupils: Pupils are equal, round, and reactive to light.  Cardiovascular:     Rate and Rhythm: Normal rate and regular rhythm.     Heart sounds: Normal heart sounds.  Pulmonary:     Effort: Pulmonary effort is normal.     Breath sounds: Normal breath sounds. No stridor. No wheezing.  Abdominal:     General: Bowel sounds are normal.     Palpations: Abdomen is soft.     Tenderness: There is no abdominal tenderness. There is no rebound.  Musculoskeletal:        General: Normal range of motion.     Cervical back: Normal range of motion.  Skin:    General: Skin is warm and dry.  Neurological:     Mental Status: He is alert and oriented to person, place, and time.      Comments: Awake, alert, oriented, speech is dysarthric but no significant aphasia noted, right sided upper and lower extremity weakness compared with left     ED Results / Procedures / Treatments   Labs (all labs ordered are listed, but only abnormal results are displayed) Labs Reviewed  APTT - Abnormal; Notable for the following components:      Result Value   aPTT 20 (*)    All other components within normal limits  CBG MONITORING, ED - Abnormal; Notable for the following components:   Glucose-Capillary 121 (*)    All other components within normal limits  SARS CORONAVIRUS 2 BY RT PCR (HOSPITAL ORDER, [facility])  PROTIME-INR  ETHANOL  COMPREHENSIVE METABOLIC PANEL  RAPID URINE DRUG SCREEN, HOSP PERFORMED  URINALYSIS, ROUTINE W REFLEX MICROSCOPIC  CBC WITH DIFFERENTIAL/PLATELET  PROTIME-INR  APTT  I-STAT CHEM 8, ED    EKG None  Radiology CT ANGIO HEAD W OR WO CONTRAST  Result Date: 08/24/2019 CLINICAL DATA:  Right arm weakness and slurred speech EXAM: CT ANGIOGRAPHY HEAD AND NECK TECHNIQUE: Multidetector CT imaging of the head and neck was performed using the standard protocol during bolus administration of intravenous contrast. Multiplanar CT image reconstructions and MIPs were obtained to evaluate the vascular anatomy. Carotid stenosis measurements (when applicable) are obtained utilizing NASCET criteria, using the distal internal carotid diameter as the denominator. CONTRAST:  13mL OMNIPAQUE IOHEXOL 350 MG/ML SOLN COMPARISON:  None. FINDINGS: CTA NECK FINDINGS Aortic arch: 2 vessel branching.  No acute finding. Right carotid system: Moderate calcified plaque at the bulb without flow limiting stenosis or ulceration. Left carotid system: Calcified plaque at the bulb which is occluded. No flow seen in the left ICA. Vertebral arteries: No proximal subclavian stenosis. Slight left vertebral artery dominance. The vertebral arteries appear smooth and  widely patent to the dura Skeleton: No acute or aggressive finding. Other neck: No acute finding Upper chest: Negative Review of the MIP images confirms the above findings CTA HEAD FINDINGS Anterior circulation: Intermittent flow in the left ICA at the skull base with final reconstitution at the supraclinoid segment. There is a hypoplastic left A1 segment. Present left posterior communicating artery. Along the anterior and inferior aspect of the M1 segment is a extrinsic filling defect appearance suggesting nonocclusive thrombus at this level. Allowing for quality of bolus density no downstream branch occlusion is seen. Posterior circulation: The vertebral and basilar arteries are smooth and widely patent. No branch occlusion or aneurysm.  Venous sinuses: Negative Anatomic variants: Negative Critical Value/emergent results were called by telephone at the time of interpretation on 08/24/2019 at 4:51 am to provider Samara Snide , who verbally acknowledged these results. Review of the MIP images confirms the above findings IMPRESSION: 1. Left ICA occlusion at the bulb with supraclinoid reconstitution. There is a left posterior communicating artery and hypoplastic A1 segment. 2. Nonocclusive thrombus at the left M1 segment. Electronically Signed   By: Monte Fantasia M.D.   On: 08/24/2019 04:58   CT ANGIO NECK W OR WO CONTRAST  Result Date: 08/24/2019 CLINICAL DATA:  Right arm weakness and slurred speech EXAM: CT ANGIOGRAPHY HEAD AND NECK TECHNIQUE: Multidetector CT imaging of the head and neck was performed using the standard protocol during bolus administration of intravenous contrast. Multiplanar CT image reconstructions and MIPs were obtained to evaluate the vascular anatomy. Carotid stenosis measurements (when applicable) are obtained utilizing NASCET criteria, using the distal internal carotid diameter as the denominator. CONTRAST:  69mL OMNIPAQUE IOHEXOL 350 MG/ML SOLN COMPARISON:  None. FINDINGS: CTA NECK  FINDINGS Aortic arch: 2 vessel branching.  No acute finding. Right carotid system: Moderate calcified plaque at the bulb without flow limiting stenosis or ulceration. Left carotid system: Calcified plaque at the bulb which is occluded. No flow seen in the left ICA. Vertebral arteries: No proximal subclavian stenosis. Slight left vertebral artery dominance. The vertebral arteries appear smooth and widely patent to the dura Skeleton: No acute or aggressive finding. Other neck: No acute finding Upper chest: Negative Review of the MIP images confirms the above findings CTA HEAD FINDINGS Anterior circulation: Intermittent flow in the left ICA at the skull base with final reconstitution at the supraclinoid segment. There is a hypoplastic left A1 segment. Present left posterior communicating artery. Along the anterior and inferior aspect of the M1 segment is a extrinsic filling defect appearance suggesting nonocclusive thrombus at this level. Allowing for quality of bolus density no downstream branch occlusion is seen. Posterior circulation: The vertebral and basilar arteries are smooth and widely patent. No branch occlusion or aneurysm. Venous sinuses: Negative Anatomic variants: Negative Critical Value/emergent results were called by telephone at the time of interpretation on 08/24/2019 at 4:51 am to provider Samara Snide , who verbally acknowledged these results. Review of the MIP images confirms the above findings IMPRESSION: 1. Left ICA occlusion at the bulb with supraclinoid reconstitution. There is a left posterior communicating artery and hypoplastic A1 segment. 2. Nonocclusive thrombus at the left M1 segment. Electronically Signed   By: Monte Fantasia M.D.   On: 08/24/2019 04:58   CT CEREBRAL PERFUSION W CONTRAST  Result Date: 08/24/2019 CLINICAL DATA:  Large vessel occlusion. Stroke scale has improved 0 currently EXAM: CT PERFUSION BRAIN TECHNIQUE: Multiphase CT imaging of the brain was performed following IV  bolus contrast injection. Subsequent parametric perfusion maps were calculated using RAPID software. CONTRAST:  2mL OMNIPAQUE IOHEXOL 350 MG/ML SOLN COMPARISON:  CT and CTA from earlier today FINDINGS: CT Brain Perfusion Findings: CBF (<30%) Volume: 73mL Perfusion (Tmax>6.0s) volume: 15mL Mismatch Volume: 71mL ASPECTS on noncontrast CT Head: 10 at 4:35 a.m. today. Infarct Core: 0 mL IMPRESSION: 12 cc of penumbra in the left frontal parietal region. No evidence of core infarct. Electronically Signed   By: Monte Fantasia M.D.   On: 08/24/2019 05:04   CT HEAD CODE STROKE WO CONTRAST  Result Date: 08/24/2019 CLINICAL DATA:  Code stroke. Ataxia with stroke suspected. Slurred speech EXAM: CT HEAD WITHOUT CONTRAST TECHNIQUE: Contiguous axial images  were obtained from the base of the skull through the vertex without intravenous contrast. COMPARISON:  None. FINDINGS: Brain: No evidence of acute infarction, hemorrhage, hydrocephalus, extra-axial collection or mass lesion/mass effect. Vascular: Dense left proximal MCA Skull: Negative Sinuses/Orbits: Negative Other: These results were communicated to Dr. Lorraine Lax at 4:37 amon 6/9/2021by text page via the [facility] messaging system. CTA is already pending ASPECTS [facility] Stroke Program Early CT Score) - Ganglionic level infarction (caudate, lentiform nuclei, internal capsule, insula, M1-M3 cortex): 7 - Supraganglionic infarction (M4-M6 cortex): 3 Total score (0-10 with 10 being normal): 10 IMPRESSION: Dense left MCA with no visible infarct. No intracranial hemorrhage. ASPECTS is 10. Electronically Signed   By: Monte Fantasia M.D.   On: 08/24/2019 04:38    Procedures Procedures (including critical care time)  CRITICAL CARE Performed by: Larene Pickett   Total critical care time: 35 minutes  Critical care time was exclusive of separately billable procedures and treating other patients.  Critical care was necessary to treat or prevent imminent or life-threatening  deterioration.  Critical care was time spent personally by me on the following activities: development of treatment plan with patient and/or surrogate as well as nursing, discussions with consultants, evaluation of patient's response to treatment, examination of patient, obtaining history from patient or surrogate, ordering and performing treatments and interventions, ordering and review of laboratory studies, ordering and review of radiographic studies, pulse oximetry and re-evaluation of patient's condition.   Medications Ordered in ED Medications   stroke: mapping our early stages of recovery book (has no administration in time range)  0.9 %  sodium chloride infusion ( Intravenous New Bag/Given 08/24/19 0543)  acetaminophen (TYLENOL) tablet 650 mg (has no administration in time range)    Or  acetaminophen (TYLENOL) 160 MG/5ML solution 650 mg (has no administration in time range)    Or  acetaminophen (TYLENOL) suppository 650 mg (has no administration in time range)  senna-docusate (Senokot-S) tablet 1 tablet (has no administration in time range)  fentaNYL (SUBLIMAZE) 100 MCG/2ML injection (has no administration in time range)  verapamil (ISOPTIN) 2.5 MG/ML injection (has no administration in time range)  eptifibatide (INTEGRILIN) 20 MG/10ML injection (has no administration in time range)  ceFAZolin (ANCEF) 2-4 GM/100ML-% IVPB (has no administration in time range)  nitroGLYCERIN 100 mcg/mL intra-arterial injection (has no administration in time range)  aspirin 81 MG chewable tablet (has no administration in time range)  ticagrelor (BRILINTA) 90 MG tablet (has no administration in time range)  iohexol (OMNIPAQUE) 350 MG/ML injection 80 mL (80 mLs Intravenous Contrast Given 08/24/19 0448)  iohexol (OMNIPAQUE) 350 MG/ML injection 40 mL (40 mLs Intravenous Contrast Given 08/24/19 0457)    ED Course  I have reviewed the triage vital signs and the nursing notes.  Pertinent labs & imaging results  that were available during my care of the patient were reviewed by me and considered in my medical decision making (see chart for details).    MDM Rules/Calculators/A&P    69 year old male presenting to the ED as a code stroke.  Patient last known well 11:30 PM when went to bed, woke up at 3:30 AM with right-sided weakness, slurred speech, right facial droop.  Symptoms still present on arrival, initial NIH 4.  Head CT without findings.  CTA with evidence of LVO-- left ICA occlusion along with non-occlusive thrombus at M1.  After scan patient's symptoms resolved and NIH was 0.  Plan per neurology-- monitor Q30 minute checks.    5:22 AM Patient with worsening  of symptoms again, will plan to take to IR.  Dr. Lorraine Lax to admit and will discuss with IR, Dr. Estanislado Pandy.  Final Clinical Impression(s) / ED Diagnoses Final diagnoses:  Acute ischemic left ICA stroke [facility])    Rx / DC Orders ED Discharge Orders    None       Larene Pickett, PA-C 08/24/19 0630    Merryl Hacker, MD 08/24/19 [phone]

## 2019-08-24 NOTE — Procedures (Addendum)
S/P Bilateral common carotid arteriograms followed by complete revascularization of occluded  LT ICA and Lt MCA with x 1 pass with penumbra 22mm x 37 mm and aspiration achieving a TICI 3 revascularization. S/P stent assisted angioplasty of acutely occluded Lt ICA prox with complete revascularization.  Post CT brain NO ICH or mass effect. RT groin puncture site closes with 25F angioseal . Distal pulses palpable DP and PT bilaterally . Patient extubated. Moving LT side spontaneously. NO movement in RT arm and leg. Repeat CT brain at 11 am. Patient given Cangrelor bolus and infusion IV at time of angioplasty. Cagrelor infusion to continue for approx 4 hrs.  Start brilinta  90 mg BID at 10 am  S.Jniya Madara MDhrs.

## 2019-08-24 NOTE — Addendum Note (Signed)
Addendum  created 08/24/19 1247 by Orlie Dakin, CRNA   Flowsheet accepted, Intraprocedure Flowsheets edited

## 2019-08-24 NOTE — ED Notes (Signed)
Dr. Randal Buba arrive to EMS bay as code stroke activated at 04:21, pt cleared for CT and arrive to CT 04:27.

## 2019-08-24 NOTE — Anesthesia Preprocedure Evaluation (Signed)
Anesthesia Evaluation  Patient identified by MRN, date of birth, ID band Patient awake    Reviewed: Allergy & Precautions, NPO status , Patient's Chart, lab work & pertinent test resultsPreop documentation limited or incomplete due to emergent nature of procedure.  History of Anesthesia Complications Negative for: history of anesthetic complications  Airway Mallampati: III  TM Distance: >3 FB Neck ROM: Full    Dental no notable dental hx. (+) Dental Advisory Given   Pulmonary neg pulmonary ROS, former smoker,           Cardiovascular hypertension, Pt. on medications negative cardio ROS       Neuro/Psych CVA negative psych ROS   GI/Hepatic negative GI ROS, Neg liver ROS,   Endo/Other  Morbid obesity  Renal/GU negative Renal ROS  negative genitourinary   Musculoskeletal negative musculoskeletal ROS (+)   Abdominal   Peds negative pediatric ROS (+)  Hematology negative hematology ROS (+)   Anesthesia Other Findings   Reproductive/Obstetrics negative OB ROS                             Anesthesia Physical Anesthesia Plan  ASA: emergent  Anesthesia Plan: General   Post-op Pain Management:    Induction: Intravenous  PONV Risk Score and Plan: 2 and Ondansetron and Propofol infusion  Airway Management Planned: Oral ETT and Video Laryngoscope Planned  Additional Equipment: Arterial line  Intra-op Plan:   Post-operative Plan: Possible Post-op intubation/ventilation  Informed Consent: I have reviewed the patients History and Physical, chart, labs and discussed the procedure including the risks, benefits and alternatives for the proposed anesthesia with the patient or authorized representative who has indicated his/her understanding and acceptance.     Dental advisory given  Plan Discussed with: CRNA, Anesthesiologist and Surgeon  Anesthesia Plan Comments:          Anesthesia Quick Evaluation

## 2019-08-24 NOTE — TOC Benefit Eligibility Note (Signed)
Transition of Care [facility]) Benefit Eligibility Note    Patient Details  Name: Kevin Reynolds MRN: [id number] Date of Birth: [birth year]   Medication/Dose: Alveda Reasons  20 MG  DAILY  CO-PAY- $47.00   ELIQUIS   5 MG BID CO-PAY- $47.00  BRILINTA 90 MG BID  CO-PAY- $47.00  Covered?: Yes  Tier: 3 Drug  Prescription Coverage [facility]  Spoke with Person/Company/Phone Number:: MAUREEN @  [facility] [employer] # [phone]  Co-Pay: $47.00  Prior Approval: No  Deductible: Unmet(OUT-OF-POCKET:UNMET)  Additional Notes: RIVAROXABAN  and   APIXABAN : NON-FORMULARY    Memory Argue Phone Number: 08/24/2019, 12:45 PM

## 2019-08-24 NOTE — Progress Notes (Signed)
Referring Physician(s): Code Stroke- Aroor, Sushanth R  Supervising Physician: Luanne Bras  Patient Status:  [facility] - In-pt  Chief Complaint: None  Subjective:  History of acute CVA s/p cerebral arteriogram with emergent mechanical thrombectomy of left ICA and left MCA occlusions achieving a TICI 3 revascularization, along with revascularization of proximal left ICA acute occlusion using stent assisted angioplasty via right femoral approach 08/24/2019 by Dr. Estanislado Pandy. Patient laying in bed resting comfortably. He opens eyes to voice and follows simple commands. No complaints. Can spontaneously move all extremities. Right groin incision c/d/i.   Allergies: Patient has no known allergies.  Medications: Prior to Admission medications   Medication Sig Start Date End Date Taking? Authorizing Provider  amLODipine (NORVASC) 10 MG tablet Take 10 mg by mouth daily.    [provider]  cyclobenzaprine (FLEXERIL) 10 MG tablet Take 1 tablet (10 mg total) 3 (three) times daily as needed by mouth for muscle spasms. 01/30/17   Sherwood Gambler, MD  ergocalciferol (VITAMIN D2) 50000 units capsule Take 50,000 Units once a week by mouth.    [provider]  loratadine (CLARITIN) 10 MG tablet Take 10 mg by mouth daily as needed for allergies.    [provider]  mometasone (NASONEX) 50 MCG/ACT nasal spray Place 2 sprays into the nose daily.    [provider]     Vital Signs: BP (!) 114/101   Pulse 67   Temp 97.6 F (36.4 C) (Axillary)   Resp 16   Ht 5\' 8"  (1.727 m)   Wt 280 lb 6.8 oz (127.2 kg)   SpO2 97%   BMI 42.64 kg/m   Physical Exam Vitals and nursing note reviewed.  Constitutional:      General: He is not in acute distress.    Appearance: Normal appearance.  Pulmonary:     Effort: Pulmonary effort is normal. No respiratory distress.  Skin:    General: Skin is warm and dry.     Comments: Right groin incision soft without active  bleeding or hematoma.  Neurological:     Mental Status: He is alert.     Comments: Alert, awake, and oriented. Speech and comprehension intact. PERRL bilaterally. EOMs intact bilaterally without nystagmus or subjective diplopia. No facial asymmetry. Tongue midline. Can spontaneously move all extremities. Hand grip equal bilaterally. No pronator drift. Distal pulses (DPs) 1+ bilaterally.     Imaging: CT ANGIO HEAD W OR WO CONTRAST  Result Date: 08/24/2019 CLINICAL DATA:  Right arm weakness and slurred speech EXAM: CT ANGIOGRAPHY HEAD AND NECK TECHNIQUE: Multidetector CT imaging of the head and neck was performed using the standard protocol during bolus administration of intravenous contrast. Multiplanar CT image reconstructions and MIPs were obtained to evaluate the vascular anatomy. Carotid stenosis measurements (when applicable) are obtained utilizing NASCET criteria, using the distal internal carotid diameter as the denominator. CONTRAST:  56mL OMNIPAQUE IOHEXOL 350 MG/ML SOLN COMPARISON:  None. FINDINGS: CTA NECK FINDINGS Aortic arch: 2 vessel branching.  No acute finding. Right carotid system: Moderate calcified plaque at the bulb without flow limiting stenosis or ulceration. Left carotid system: Calcified plaque at the bulb which is occluded. No flow seen in the left ICA. Vertebral arteries: No proximal subclavian stenosis. Slight left vertebral artery dominance. The vertebral arteries appear smooth and widely patent to the dura Skeleton: No acute or aggressive finding. Other neck: No acute finding Upper chest: Negative Review of the MIP images confirms the above findings CTA HEAD FINDINGS Anterior circulation: Intermittent  flow in the left ICA at the skull base with final reconstitution at the supraclinoid segment. There is a hypoplastic left A1 segment. Present left posterior communicating artery. Along the anterior and inferior aspect of the M1 segment is a extrinsic filling defect  appearance suggesting nonocclusive thrombus at this level. Allowing for quality of bolus density no downstream branch occlusion is seen. Posterior circulation: The vertebral and basilar arteries are smooth and widely patent. No branch occlusion or aneurysm. Venous sinuses: Negative Anatomic variants: Negative Critical Value/emergent results were called by telephone at the time of interpretation on 08/24/2019 at 4:51 am to provider Samara Snide , who verbally acknowledged these results. Review of the MIP images confirms the above findings IMPRESSION: 1. Left ICA occlusion at the bulb with supraclinoid reconstitution. There is a left posterior communicating artery and hypoplastic A1 segment. 2. Nonocclusive thrombus at the left M1 segment. Electronically Signed   By: Monte Fantasia M.D.   On: 08/24/2019 04:58   CT ANGIO NECK W OR WO CONTRAST  Result Date: 08/24/2019 CLINICAL DATA:  Right arm weakness and slurred speech EXAM: CT ANGIOGRAPHY HEAD AND NECK TECHNIQUE: Multidetector CT imaging of the head and neck was performed using the standard protocol during bolus administration of intravenous contrast. Multiplanar CT image reconstructions and MIPs were obtained to evaluate the vascular anatomy. Carotid stenosis measurements (when applicable) are obtained utilizing NASCET criteria, using the distal internal carotid diameter as the denominator. CONTRAST:  19mL OMNIPAQUE IOHEXOL 350 MG/ML SOLN COMPARISON:  None. FINDINGS: CTA NECK FINDINGS Aortic arch: 2 vessel branching.  No acute finding. Right carotid system: Moderate calcified plaque at the bulb without flow limiting stenosis or ulceration. Left carotid system: Calcified plaque at the bulb which is occluded. No flow seen in the left ICA. Vertebral arteries: No proximal subclavian stenosis. Slight left vertebral artery dominance. The vertebral arteries appear smooth and widely patent to the dura Skeleton: No acute or aggressive finding. Other neck: No acute  finding Upper chest: Negative Review of the MIP images confirms the above findings CTA HEAD FINDINGS Anterior circulation: Intermittent flow in the left ICA at the skull base with final reconstitution at the supraclinoid segment. There is a hypoplastic left A1 segment. Present left posterior communicating artery. Along the anterior and inferior aspect of the M1 segment is a extrinsic filling defect appearance suggesting nonocclusive thrombus at this level. Allowing for quality of bolus density no downstream branch occlusion is seen. Posterior circulation: The vertebral and basilar arteries are smooth and widely patent. No branch occlusion or aneurysm. Venous sinuses: Negative Anatomic variants: Negative Critical Value/emergent results were called by telephone at the time of interpretation on 08/24/2019 at 4:51 am to provider Samara Snide , who verbally acknowledged these results. Review of the MIP images confirms the above findings IMPRESSION: 1. Left ICA occlusion at the bulb with supraclinoid reconstitution. There is a left posterior communicating artery and hypoplastic A1 segment. 2. Nonocclusive thrombus at the left M1 segment. Electronically Signed   By: Monte Fantasia M.D.   On: 08/24/2019 04:58   CT CEREBRAL PERFUSION W CONTRAST  Result Date: 08/24/2019 CLINICAL DATA:  Large vessel occlusion. Stroke scale has improved 0 currently EXAM: CT PERFUSION BRAIN TECHNIQUE: Multiphase CT imaging of the brain was performed following IV bolus contrast injection. Subsequent parametric perfusion maps were calculated using RAPID software. CONTRAST:  36mL OMNIPAQUE IOHEXOL 350 MG/ML SOLN COMPARISON:  CT and CTA from earlier today FINDINGS: CT Brain Perfusion Findings: CBF (<30%) Volume: 108mL Perfusion (Tmax>6.0s) volume: 82mL  Mismatch Volume: 58mL ASPECTS on noncontrast CT Head: 10 at 4:35 a.m. today. Infarct Core: 0 mL IMPRESSION: 12 cc of penumbra in the left frontal parietal region. No evidence of core infarct.  Electronically Signed   By: Monte Fantasia M.D.   On: 08/24/2019 05:04   CT HEAD CODE STROKE WO CONTRAST  Result Date: 08/24/2019 CLINICAL DATA:  Code stroke. Ataxia with stroke suspected. Slurred speech EXAM: CT HEAD WITHOUT CONTRAST TECHNIQUE: Contiguous axial images were obtained from the base of the skull through the vertex without intravenous contrast. COMPARISON:  None. FINDINGS: Brain: No evidence of acute infarction, hemorrhage, hydrocephalus, extra-axial collection or mass lesion/mass effect. Vascular: Dense left proximal MCA Skull: Negative Sinuses/Orbits: Negative Other: These results were communicated to Dr. Lorraine Lax at 4:37 amon 6/9/2021by text page via the [facility] messaging system. CTA is already pending ASPECTS [facility] Stroke Program Early CT Score) - Ganglionic level infarction (caudate, lentiform nuclei, internal capsule, insula, M1-M3 cortex): 7 - Supraganglionic infarction (M4-M6 cortex): 3 Total score (0-10 with 10 being normal): 10 IMPRESSION: Dense left MCA with no visible infarct. No intracranial hemorrhage. ASPECTS is 10. Electronically Signed   By: Monte Fantasia M.D.   On: 08/24/2019 04:38    Labs:  CBC: Recent Labs    08/24/19 0547 08/24/19 0556  WBC 5.9  --   HGB 16.0 16.7  HCT 48.9 49.0  PLT 193  --     COAGS: Recent Labs    08/24/19 0435 08/24/19 0547  INR 1.2 1.0  APTT 20* 24    BMP: Recent Labs    08/24/19 0435 08/24/19 0556  NA 138 140  K 3.7 4.2  CL 108 103  CO2 21*  --   GLUCOSE 137* 129*  BUN 18 25*  CALCIUM 9.0  --   CREATININE 1.27* 1.20  GFRNONAA 58*  --   GFRAA >60  --     LIVER FUNCTION TESTS: Recent Labs    08/24/19 0435  BILITOT 0.7  AST 30  ALT 25  ALKPHOS 56  PROT 6.9  ALBUMIN 3.9    Assessment and Plan:  History of acute CVA s/p cerebral arteriogram with emergent mechanical thrombectomy of left ICA and left MCA occlusions achieving a TICI 3 revascularization, along with revascularization of proximal left ICA acute  occlusion using stent assisted angioplasty via right femoral approach 08/24/2019 by Dr. Estanislado Pandy. Patient's condition stable- awake and following commands, can spontaneously move all extremities. Right groin incision stable, distal pulses (DPs) 1+ bilaterally. For CT head today. Continue taking Brilinta 90 mg twice daily and Aspirin 81 mg once daily. Further plans per neurology- appreciate and agree with management. NIR to follow.   Electronically Signed: Earley Abide, PA-C 08/24/2019, 11:17 AM   I spent a total of 25 Minutes at the the patient's bedside AND on the patient's hospital floor or unit, greater than 50% of which was counseling/coordinating care for CVA s/p revascularization.

## 2019-08-24 NOTE — Progress Notes (Signed)
STROKE TEAM PROGRESS NOTE   INTERVAL HISTORY0 His wife is at the bedside.  Patient is awake, alert, communicative.  He had cortrak placed in PACU.  He should be able to swallow.  Swallow screen not yet done.  I have personally reviewed history of present illness with the patient, his wife, electronic medical records and imaging films in PACS.  He presented with aphasia and right-sided weakness secondary to proximal left carotid occlusion with distal embolization to the MCA.  He underwent successful mechanical thrombectomy along with rescue left proximal carotid artery angioplasty and stenting.  He is doing well.  Is extubated.  His speech and language appear to have improved and so has his right-sided strength.  He remains on Cleviprex drip for blood pressure control. Vitals:   08/24/19 0952 08/24/19 1010 08/24/19 1015 08/24/19 1040  BP:  (!) 114/101    Pulse: 68 77  67  Resp:  19  16  Temp: (!) 97.2 F (36.2 C)  97.6 F (36.4 C)   TempSrc:   Axillary   SpO2: 97% 97%    Weight:      Height:        CBC:  Recent Labs  Lab 08/24/19 0547 08/24/19 0556  WBC 5.9  --   NEUTROABS 3.3  --   HGB 16.0 16.7  HCT 48.9 49.0  MCV 89.7  --   PLT 193  --     Basic Metabolic Panel:  Recent Labs  Lab 08/24/19 0435 08/24/19 0556  NA 138 140  K 3.7 4.2  CL 108 103  CO2 21*  --   GLUCOSE 137* 129*  BUN 18 25*  CREATININE 1.27* 1.20  CALCIUM 9.0  --    Lipid Panel: No results found for: CHOL, TRIG, HDL, CHOLHDL, VLDL, LDLCALC HgbA1c: No results found for: HGBA1C Urine Drug Screen:     Component Value Date/Time   LABOPIA NONE DETECTED 08/24/2019 0520   COCAINSCRNUR NONE DETECTED 08/24/2019 0520   LABBENZ NONE DETECTED 08/24/2019 0520   AMPHETMU NONE DETECTED 08/24/2019 0520   THCU NONE DETECTED 08/24/2019 0520   LABBARB NONE DETECTED 08/24/2019 0520    Alcohol Level     Component Value Date/Time   ETH <10 08/24/2019 0435    IMAGING past 24 hours CT ANGIO HEAD W OR WO  CONTRAST  Result Date: 08/24/2019 CLINICAL DATA:  Right arm weakness and slurred speech EXAM: CT ANGIOGRAPHY HEAD AND NECK TECHNIQUE: Multidetector CT imaging of the head and neck was performed using the standard protocol during bolus administration of intravenous contrast. Multiplanar CT image reconstructions and MIPs were obtained to evaluate the vascular anatomy. Carotid stenosis measurements (when applicable) are obtained utilizing NASCET criteria, using the distal internal carotid diameter as the denominator. CONTRAST:  30mL OMNIPAQUE IOHEXOL 350 MG/ML SOLN COMPARISON:  None. FINDINGS: CTA NECK FINDINGS Aortic arch: 2 vessel branching.  No acute finding. Right carotid system: Moderate calcified plaque at the bulb without flow limiting stenosis or ulceration. Left carotid system: Calcified plaque at the bulb which is occluded. No flow seen in the left ICA. Vertebral arteries: No proximal subclavian stenosis. Slight left vertebral artery dominance. The vertebral arteries appear smooth and widely patent to the dura Skeleton: No acute or aggressive finding. Other neck: No acute finding Upper chest: Negative Review of the MIP images confirms the above findings CTA HEAD FINDINGS Anterior circulation: Intermittent flow in the left ICA at the skull base with final reconstitution at the supraclinoid segment. There is a hypoplastic left  A1 segment. Present left posterior communicating artery. Along the anterior and inferior aspect of the M1 segment is a extrinsic filling defect appearance suggesting nonocclusive thrombus at this level. Allowing for quality of bolus density no downstream branch occlusion is seen. Posterior circulation: The vertebral and basilar arteries are smooth and widely patent. No branch occlusion or aneurysm. Venous sinuses: Negative Anatomic variants: Negative Critical Value/emergent results were called by telephone at the time of interpretation on 08/24/2019 at 4:51 am to provider Samara Snide ,  who verbally acknowledged these results. Review of the MIP images confirms the above findings IMPRESSION: 1. Left ICA occlusion at the bulb with supraclinoid reconstitution. There is a left posterior communicating artery and hypoplastic A1 segment. 2. Nonocclusive thrombus at the left M1 segment. Electronically Signed   By: Monte Fantasia M.D.   On: 08/24/2019 04:58   CT ANGIO NECK W OR WO CONTRAST  Result Date: 08/24/2019 CLINICAL DATA:  Right arm weakness and slurred speech EXAM: CT ANGIOGRAPHY HEAD AND NECK TECHNIQUE: Multidetector CT imaging of the head and neck was performed using the standard protocol during bolus administration of intravenous contrast. Multiplanar CT image reconstructions and MIPs were obtained to evaluate the vascular anatomy. Carotid stenosis measurements (when applicable) are obtained utilizing NASCET criteria, using the distal internal carotid diameter as the denominator. CONTRAST:  42mL OMNIPAQUE IOHEXOL 350 MG/ML SOLN COMPARISON:  None. FINDINGS: CTA NECK FINDINGS Aortic arch: 2 vessel branching.  No acute finding. Right carotid system: Moderate calcified plaque at the bulb without flow limiting stenosis or ulceration. Left carotid system: Calcified plaque at the bulb which is occluded. No flow seen in the left ICA. Vertebral arteries: No proximal subclavian stenosis. Slight left vertebral artery dominance. The vertebral arteries appear smooth and widely patent to the dura Skeleton: No acute or aggressive finding. Other neck: No acute finding Upper chest: Negative Review of the MIP images confirms the above findings CTA HEAD FINDINGS Anterior circulation: Intermittent flow in the left ICA at the skull base with final reconstitution at the supraclinoid segment. There is a hypoplastic left A1 segment. Present left posterior communicating artery. Along the anterior and inferior aspect of the M1 segment is a extrinsic filling defect appearance suggesting nonocclusive thrombus at this  level. Allowing for quality of bolus density no downstream branch occlusion is seen. Posterior circulation: The vertebral and basilar arteries are smooth and widely patent. No branch occlusion or aneurysm. Venous sinuses: Negative Anatomic variants: Negative Critical Value/emergent results were called by telephone at the time of interpretation on 08/24/2019 at 4:51 am to provider Samara Snide , who verbally acknowledged these results. Review of the MIP images confirms the above findings IMPRESSION: 1. Left ICA occlusion at the bulb with supraclinoid reconstitution. There is a left posterior communicating artery and hypoplastic A1 segment. 2. Nonocclusive thrombus at the left M1 segment. Electronically Signed   By: Monte Fantasia M.D.   On: 08/24/2019 04:58   CT CEREBRAL PERFUSION W CONTRAST  Result Date: 08/24/2019 CLINICAL DATA:  Large vessel occlusion. Stroke scale has improved 0 currently EXAM: CT PERFUSION BRAIN TECHNIQUE: Multiphase CT imaging of the brain was performed following IV bolus contrast injection. Subsequent parametric perfusion maps were calculated using RAPID software. CONTRAST:  53mL OMNIPAQUE IOHEXOL 350 MG/ML SOLN COMPARISON:  CT and CTA from earlier today FINDINGS: CT Brain Perfusion Findings: CBF (<30%) Volume: 21mL Perfusion (Tmax>6.0s) volume: 80mL Mismatch Volume: 25mL ASPECTS on noncontrast CT Head: 10 at 4:35 a.m. today. Infarct Core: 0 mL IMPRESSION: 12 cc of  penumbra in the left frontal parietal region. No evidence of core infarct. Electronically Signed   By: Monte Fantasia M.D.   On: 08/24/2019 05:04   CT HEAD CODE STROKE WO CONTRAST  Result Date: 08/24/2019 CLINICAL DATA:  Code stroke. Ataxia with stroke suspected. Slurred speech EXAM: CT HEAD WITHOUT CONTRAST TECHNIQUE: Contiguous axial images were obtained from the base of the skull through the vertex without intravenous contrast. COMPARISON:  None. FINDINGS: Brain: No evidence of acute infarction, hemorrhage, hydrocephalus,  extra-axial collection or mass lesion/mass effect. Vascular: Dense left proximal MCA Skull: Negative Sinuses/Orbits: Negative Other: These results were communicated to Dr. Lorraine Lax at 4:37 amon 6/9/2021by text page via the [facility] messaging system. CTA is already pending ASPECTS [facility] Stroke Program Early CT Score) - Ganglionic level infarction (caudate, lentiform nuclei, internal capsule, insula, M1-M3 cortex): 7 - Supraganglionic infarction (M4-M6 cortex): 3 Total score (0-10 with 10 being normal): 10 IMPRESSION: Dense left MCA with no visible infarct. No intracranial hemorrhage. ASPECTS is 10. Electronically Signed   By: Monte Fantasia M.D.   On: 08/24/2019 04:38    PHYSICAL EXAM Pleasant middle-age obese African-American male not in distress. . Afebrile. Head is nontraumatic. Neck is supple without bruit.    Cardiac exam no murmur or gallop. Lungs are clear to auscultation. Distal pulses are well felt. Neurological Exam : Awake alert oriented to time place and person.  Slightly nonfluent speech with word hesitancy but no paraphasic errors.  Good comprehension, naming repetition.  No dysarthria.  Extraocular movements are full range without nystagmus.  Blinks to threat bilaterally.  Subtle right lower facial asymmetry when he smiles.  Tongue midline.  Motor system exam no upper or lower extremity drift.  Symmetric equal strength in all 4 extremities.  Diminished fine finger movements on the right.  Orbits left over right upper extremity.  Symmetric lower extremity strength.  Sensation appears intact bilaterally.  Coordination is slow but accurate.  Gait not tested.  NIH stroke scale 2 premorbid modified Rankin score 0  ASSESSMENT/PLAN [name] is a 69 y.o. male with history of hypertension presenting with right-sided weakness, slurred speech and right facial droop.  No TPA given out of window.  CT with L ICA occlusion and left M1 nonocclusive thrombus.  Taken to IR.  Stroke:   L MCA  infarct s/p L ICA and MCA revascularization w/ L ICA stent following angioplasty, infarct likely embolic secondary to unknown source  Code Stroke CT head dense left MCA.  ASPECTS 10.     CTA head & neck left ICA occlusion with supraclinoid reconstitution.  Left M1 M1 nonocclusive thrombus.  CT perfusion left frontal parietal 12 cc penumbra.  No core infarct.  Cerebral angiogram occluded L ICA and L MCA with TICI3 revascularization using penumbra and aspiration, s/p stent assisted angioplasty of occluded L ICA proximally with complete revascularization. Treated with cangrelor at time of angioplasty.  Post IR CT no ICH or mass-effect  MRI left posterior putamen, midportion caudate body, punctate deep insular cortex and frontoparietal junction subcortical white matter infarcts  2D Echo pending  LDL pending   HgbA1c pending   SCDs for VTE prophylaxis. Add lovenox  No antithrombotic prior to admission, now on aspirin 81 mg daily and Brilinta (ticagrelor) 90 mg bid following aspirin load and cangrelor bolus and infusion.  (Co Pay $47)  Therapy recommendations: pending   Disposition:  pending   Hypertension  Home meds: Norvasc 10  On Cleviprex  . BP goal per IR x  24h following IR procedure  . Stable  . Long-term BP goal normotensive  Hyperlipidemia  Home meds:  No statin  Add lipitor 40  LDL pending, goal < 70  Continue statin at discharge  Dysphagia . Currently NPO . coretrak placed in PACU . Anticipate he will be able to swallow . Check swallow screen   Other Stroke Risk Factors  Advanced age  Former cigarette smoker  ETOH use, alcohol level <10, advised to drink no more than 2 drink(s) a day  Morbid Obesity, Body mass index is 42.64 kg/m., recommend weight loss, diet and exercise as appropriate   Other Active Problems  Allergic rhinitis on Claritin and Nasonex prior to admission  Hospital day # 0 He presented with speech difficulties and right-sided  weakness with fluctuating exam and was found to have proximal left carotid occlusion with distal embolization with a salvageable penumbra.  He was treated with emergent mechanical thrombectomy requiring rescue angioplasty and stenting of the left proximal carotid.  He seems to have done well and needs close neurological monitoring and strict blood pressure control with systolic [id number] for the first 24 hours.  Continue aspirin and Brilinta for his stent.  Check MRI scan of the brain later today.  Check echocardiogram and ongoing stroke work-up and aggressive risk factor modification.  Patient appears to be at risk for sleep apnea given his obesity, snoring and witnessed apnea by his wife.  He may benefit with participation in the sleep smart study and was given information to review and decide.  Long discussion with patient and wife and Dr. Estanislado Pandy and answered questions This patient is critically ill and at significant risk of neurological worsening, death and care requires constant monitoring of vital signs, hemodynamics,respiratory and cardiac monitoring, extensive review of multiple databases, frequent neurological assessment, discussion with family, other specialists and medical decision making of high complexity.I have made any additions or clarifications directly to the above note.This critical care time does not reflect procedure time, or teaching time or supervisory time of PA/NP/Med Resident etc but could involve care discussion time.  I spent 35 minutes of neurocritical care time  in the care of  this patient.   Antony Contras, MD  To contact Stroke Continuity provider, please refer to [email or web address] After hours, contact General Neurology

## 2019-08-24 NOTE — Anesthesia Postprocedure Evaluation (Signed)
Anesthesia Post Note  Patient: Kevin Reynolds  Procedure(s) Performed: IR WITH ANESTHESIA (N/A )     Patient location during evaluation: PACU Anesthesia Type: General Level of consciousness: sedated and patient cooperative Pain management: pain level controlled Vital Signs Assessment: post-procedure vital signs reviewed and stable Respiratory status: spontaneous breathing Cardiovascular status: stable Anesthetic complications: no    Last Vitals:  Vitals:   08/24/19 1100 08/24/19 1115  BP: 111/60 120/66  Pulse: 66 77  Resp: 15 18  Temp:    SpO2: 96% 96%    Last Pain:  Vitals:   08/24/19 1015  TempSrc: Axillary  PainSc:                  Nolon Nations

## 2019-08-24 NOTE — ED Notes (Signed)
Pt arrive to IR suit, IR team report given, pt prepared by IR team for intubation.

## 2019-08-24 NOTE — Significant Event (Addendum)
Rapid Response Event Note  Code stroke called at 0405. Pt arrived at [facility]. States he went to bed around 2330 and woke up to use the bathroom and was found to have R sided weakness and slurred speech. Initial NIH on arrival 4. CT, CTA, CT Perfusion done. LVO noted, however at [place] improved to 0. At [place] made to monitor pt with q47min neuro checks. Any worsening or change in neuro status plan to call IR per Dr Lorraine Lax.     0521: NIH changed bacck to 4 with dysarthria and R sided facial droop and drift noted  0525: Dr Aroor calling Dr Estanislado Pandy [phone] IR Team paged     Sherilyn Dacosta

## 2019-08-24 NOTE — H&P (Addendum)
Requesting Physician: Dr. Randal Buba    Chief Complaint: right side weakness, slurred speech  History obtained from: Patient and Chart    HPI:                                                                                                                                       Kevin Reynolds is a 69 y.o. male with past medical history significant for hypertension presents to the emergency department with slurred speech right-sided weakness on waking up at 3:30 in the morning.  Last known normal is 11:30 PM when patient went to bed.  On waking up, slurred speech, right facial droop right-sided weakness and EMS was called.  Blood pressure was 998 systolic.  Glucose was 117.  LVO score was 2-patient was brought to [name], ED   ED course:   On initial assessment stroke scale is 4-2 for facial droop, 1 for slurred speech, 1 yes right upper extremity drift. CT head showed no hemorrhage, was concerning for a hyperdense left MCA.  CT angiogram was performed-demonstrated left ICA occlusion at the bulb and the left M1 nonocclusive thrombus.  However, following CT head-patient symptoms completely resolved and NIHSS was 0 at 4.48 am.  To evaluate area of risk-CT perfusion was performed which showed no cord and ischemic penumbra of 12 cc on rapid software (likely underestimated value).   Patient was observed in the emergency room closely, around 5:15 AM symptoms began to recur.  Patient scored an NIH stroke scale of 5.  Now with mild aphasia.  Spoke with wife who was present at bedside about risk versus benefit, and after obtaining verbal consent and discussion with interventional neuroradiologist Dr. Patrecia Pour was taken to IR   Date last known well:  08/23/19 Time last known well: 11.30 PM tPA Given: no, outside window NIHSS: 4>>0>>5 Baseline MRS 0    Past Medical History:  Diagnosis Date  . Arthritis    left shoulder  . Cataracts, bilateral   . Cough    related to allergies  . History of  colonoscopy   . Hypertension    takes Amlodipine daily  . Joint pain    left  . Seasonal allergies    takes Claritin daily and Nasonex daily    Past Surgical History:  Procedure Laterality Date  . CATARACT EXTRACTION W/PHACO Left 07/14/2012   Procedure: CATARACT EXTRACTION PHACO AND INTRAOCULAR LENS PLACEMENT (IOC);  Surgeon: Adonis Brook, MD;  Location: [facility];  Service: Ophthalmology;  Laterality: Left;  . COLONOSCOPY    . ESOPHAGOGASTRODUODENOSCOPY    . TONSILLECTOMY      Family History  Problem Relation Age of Onset  . Cancer Mother        Breast  . Cancer Brother        Liver   Social History:  reports that he has quit smoking. He has never used smokeless tobacco. He reports current alcohol  use. He reports that he does not use drugs.  Allergies: No Known Allergies  Medications:                                                                                                                        I reviewed home medications   ROS:                                                                                                                                     14 systems reviewed and negative except above    Examination:                                                                                                      General: Appears well-developed  Psych: Affect appropriate to situation Eyes: No scleral injection HENT: No OP obstrucion Head: Normocephalic.  Cardiovascular: Normal rate and regular rhythm.  Respiratory: Effort normal and breath sounds normal to anterior ascultation GI: Soft.  No distension. There is no tenderness.  Skin: WDI    Neurological Examination Mental Status: Alert, oriented, thought content appropriate.  Mild expressive aphasia.  Moderate to severe slurred speech.  Able to follow simple commands without difficulty. Cranial Nerves: II: Visual fields grossly normal, no visual neglect III,IV, VI: ptosis not present, extra-ocular motions  intact bilaterally, pupils equal, round, reactive to light and accommodation V,VII: smile symmetric, facial light touch sensation normal bilaterally VIII: hearing normal bilaterally IX,X: uvula rises symmetrically XI: bilateral shoulder shrug XII: midline tongue extension Motor: Right : Upper extremity   4/5    Left:     Upper extremity   5/5  Lower extremity   4/5     Lower extremity   5/5 Tone and bulk:normal tone throughout; no atrophy noted Sensory: Pinprick and light touch intact throughout, bilaterally Plantars: Right: downgoing   Left: downgoing Cerebellar: No gross ataxia noted on finger-to-nose testing      Lab Results: Basic Metabolic Panel: No results for input(s): NA,  K, CL, CO2, GLUCOSE, BUN, CREATININE, CALCIUM, MG, PHOS in the last 168 hours.  CBC: No results for input(s): WBC, NEUTROABS, HGB, HCT, MCV, PLT in the last 168 hours.  Coagulation Studies: Recent Labs    08/24/19 0435  LABPROT 14.6  INR 1.2    Imaging: CT ANGIO HEAD W OR WO CONTRAST  Result Date: 08/24/2019 CLINICAL DATA:  Right arm weakness and slurred speech EXAM: CT ANGIOGRAPHY HEAD AND NECK TECHNIQUE: Multidetector CT imaging of the head and neck was performed using the standard protocol during bolus administration of intravenous contrast. Multiplanar CT image reconstructions and MIPs were obtained to evaluate the vascular anatomy. Carotid stenosis measurements (when applicable) are obtained utilizing NASCET criteria, using the distal internal carotid diameter as the denominator. CONTRAST:  80mL OMNIPAQUE IOHEXOL 350 MG/ML SOLN COMPARISON:  None. FINDINGS: CTA NECK FINDINGS Aortic arch: 2 vessel branching.  No acute finding. Right carotid system: Moderate calcified plaque at the bulb without flow limiting stenosis or ulceration. Left carotid system: Calcified plaque at the bulb which is occluded. No flow seen in the left ICA. Vertebral arteries: No proximal subclavian stenosis. Slight left vertebral  artery dominance. The vertebral arteries appear smooth and widely patent to the dura Skeleton: No acute or aggressive finding. Other neck: No acute finding Upper chest: Negative Review of the MIP images confirms the above findings CTA HEAD FINDINGS Anterior circulation: Intermittent flow in the left ICA at the skull base with final reconstitution at the supraclinoid segment. There is a hypoplastic left A1 segment. Present left posterior communicating artery. Along the anterior and inferior aspect of the M1 segment is a extrinsic filling defect appearance suggesting nonocclusive thrombus at this level. Allowing for quality of bolus density no downstream branch occlusion is seen. Posterior circulation: The vertebral and basilar arteries are smooth and widely patent. No branch occlusion or aneurysm. Venous sinuses: Negative Anatomic variants: Negative Critical Value/emergent results were called by telephone at the time of interpretation on 08/24/2019 at 4:51 am to provider Samara Snide , who verbally acknowledged these results. Review of the MIP images confirms the above findings IMPRESSION: 1. Left ICA occlusion at the bulb with supraclinoid reconstitution. There is a left posterior communicating artery and hypoplastic A1 segment. 2. Nonocclusive thrombus at the left M1 segment. Electronically Signed   By: Monte Fantasia M.D.   On: 08/24/2019 04:58   CT ANGIO NECK W OR WO CONTRAST  Result Date: 08/24/2019 CLINICAL DATA:  Right arm weakness and slurred speech EXAM: CT ANGIOGRAPHY HEAD AND NECK TECHNIQUE: Multidetector CT imaging of the head and neck was performed using the standard protocol during bolus administration of intravenous contrast. Multiplanar CT image reconstructions and MIPs were obtained to evaluate the vascular anatomy. Carotid stenosis measurements (when applicable) are obtained utilizing NASCET criteria, using the distal internal carotid diameter as the denominator. CONTRAST:  53mL OMNIPAQUE  IOHEXOL 350 MG/ML SOLN COMPARISON:  None. FINDINGS: CTA NECK FINDINGS Aortic arch: 2 vessel branching.  No acute finding. Right carotid system: Moderate calcified plaque at the bulb without flow limiting stenosis or ulceration. Left carotid system: Calcified plaque at the bulb which is occluded. No flow seen in the left ICA. Vertebral arteries: No proximal subclavian stenosis. Slight left vertebral artery dominance. The vertebral arteries appear smooth and widely patent to the dura Skeleton: No acute or aggressive finding. Other neck: No acute finding Upper chest: Negative Review of the MIP images confirms the above findings CTA HEAD FINDINGS Anterior circulation: Intermittent flow in the left ICA at  the skull base with final reconstitution at the supraclinoid segment. There is a hypoplastic left A1 segment. Present left posterior communicating artery. Along the anterior and inferior aspect of the M1 segment is a extrinsic filling defect appearance suggesting nonocclusive thrombus at this level. Allowing for quality of bolus density no downstream branch occlusion is seen. Posterior circulation: The vertebral and basilar arteries are smooth and widely patent. No branch occlusion or aneurysm. Venous sinuses: Negative Anatomic variants: Negative Critical Value/emergent results were called by telephone at the time of interpretation on 08/24/2019 at 4:51 am to provider Samara Snide , who verbally acknowledged these results. Review of the MIP images confirms the above findings IMPRESSION: 1. Left ICA occlusion at the bulb with supraclinoid reconstitution. There is a left posterior communicating artery and hypoplastic A1 segment. 2. Nonocclusive thrombus at the left M1 segment. Electronically Signed   By: Monte Fantasia M.D.   On: 08/24/2019 04:58   CT CEREBRAL PERFUSION W CONTRAST  Result Date: 08/24/2019 CLINICAL DATA:  Large vessel occlusion. Stroke scale has improved 0 currently EXAM: CT PERFUSION BRAIN TECHNIQUE:  Multiphase CT imaging of the brain was performed following IV bolus contrast injection. Subsequent parametric perfusion maps were calculated using RAPID software. CONTRAST:  82mL OMNIPAQUE IOHEXOL 350 MG/ML SOLN COMPARISON:  CT and CTA from earlier today FINDINGS: CT Brain Perfusion Findings: CBF (<30%) Volume: 84mL Perfusion (Tmax>6.0s) volume: 71mL Mismatch Volume: 80mL ASPECTS on noncontrast CT Head: 10 at 4:35 a.m. today. Infarct Core: 0 mL IMPRESSION: 12 cc of penumbra in the left frontal parietal region. No evidence of core infarct. Electronically Signed   By: Monte Fantasia M.D.   On: 08/24/2019 05:04   CT HEAD CODE STROKE WO CONTRAST  Result Date: 08/24/2019 CLINICAL DATA:  Code stroke. Ataxia with stroke suspected. Slurred speech EXAM: CT HEAD WITHOUT CONTRAST TECHNIQUE: Contiguous axial images were obtained from the base of the skull through the vertex without intravenous contrast. COMPARISON:  None. FINDINGS: Brain: No evidence of acute infarction, hemorrhage, hydrocephalus, extra-axial collection or mass lesion/mass effect. Vascular: Dense left proximal MCA Skull: Negative Sinuses/Orbits: Negative Other: These results were communicated to Dr. Lorraine Lax at 4:37 amon 6/9/2021by text page via the [facility] messaging system. CTA is already pending ASPECTS [facility] Stroke Program Early CT Score) - Ganglionic level infarction (caudate, lentiform nuclei, internal capsule, insula, M1-M3 cortex): 7 - Supraganglionic infarction (M4-M6 cortex): 3 Total score (0-10 with 10 being normal): 10 IMPRESSION: Dense left MCA with no visible infarct. No intracranial hemorrhage. ASPECTS is 10. Electronically Signed   By: Monte Fantasia M.D.   On: 08/24/2019 04:38     ASSESSMENT AND PLAN  69 y.o. male with past medical history significant for hypertension presents to the emergency department with slurred speech right-sided weakness on waking up at 3:30 in the morning.  CTA demonstrated left ICA occlusion with left M1  nonocclusive thrombus. Symptoms improved while in the ED, however fluctuated again  NIH stroke scale 5-however disabling symptoms including aphasia which is mild. After discussion with wife based on risk versus benefit patient taken to neuro IR.    Acute left MCA ischemic stroke secondary to left ICA occlusion and left M1 nonocclusive thrombus  Risk factors Etiology:   Plan: # MRI of the brain without contrast #Transthoracic Echo  #Antiplatelet therapy depending on neuro IR procedure #Start or continue Atorvastatin 40 mg/other high intensity statin # BP goal: Per neuro IR # HBAIC and Lipid profile # Telemetry monitoring # Frequent neuro checks # stroke swallow screen  Hypertension -BP goal per neuro IR   DVT PPX; SCD Full code  Please page stroke NP  Or  PA  Or MD from 8am -4 pm  as this patient from this time will be  followed by the stroke.   You can look them up on [email or web address]  Password [email or web address]   This patient is neurologically critically ill due to stroke requiring thrombectomy he is at risk for significant risk of neurological worsening from cerebral edema,  death from brain herniation, heart failure, hemorrhagic conversion, infection, respiratory failure and seizure. This patient's care requires constant monitoring of vital signs, hemodynamics, respiratory and cardiac monitoring, review of multiple databases, neurological assessment, discussion with family, other specialists and medical decision making of high complexity.  I spent 60 minutes of neurocritical time in the care of this patient.     Wayde Gopaul [facility] Pager Number [phone]

## 2019-08-24 NOTE — Anesthesia Procedure Notes (Addendum)
Procedure Name: Intubation Date/Time: 08/24/2019 5:57 AM Performed by: Orlie Dakin, CRNA Pre-anesthesia Checklist: Patient identified, Emergency Drugs available, Suction available and Patient being monitored Patient Re-evaluated:Patient Re-evaluated prior to induction Oxygen Delivery Method: Ambu bag Preoxygenation: Pre-oxygenation with 100% oxygen Induction Type: IV induction and Rapid sequence Laryngoscope Size: Glidescope and 4 Grade View: Grade I Tube type: Oral Tube size: 7.5 mm Number of attempts: 1 Airway Equipment and Method: Stylet Placement Confirmation: ETT inserted through vocal cords under direct vision,  CO2 detector and breath sounds checked- equal and bilateral Secured at: 24 cm Tube secured with: Tape Dental Injury: Teeth and Oropharynx as per pre-operative assessment  Comments: DL and intubation per Arther Dames, CRNA.  4x4s bite block used.

## 2019-08-24 NOTE — Progress Notes (Signed)
NIR Brief Rounding Note:  History of acute CVA s/p cerebral arteriogram with emergent mechanical thrombectomy of left ICA and left MCA occlusions achieving a TICI 3 revascularization, along with revascularization of proximal left ICA acute occlusion using stent assisted angioplasty via right femoral approach 08/24/2019 by Dr. Estanislado Pandy.  MR brain today: 1. Acute infarction on the left affecting the posterior putamen, midportion of the caudate body, punctate involvement of the deep insular cortex and smudgy involvement of the frontoparietal junction subcortical white matter. No mass effect or hemorrhage.  Went to evaluation patient bedside alongside Dr. Estanislado Pandy. Patient awake and alert laying in bed with no complaints at this time. Male family member at bedside. Discussed MRI results with patient/family, all questions answered and concerns addressed.  Alert, awake, and oriented x3, He follows simple commands. Speech delayed but clear. PERRL bilaterally. No facial asymmetry. Tongue midline. Can spontaneously move all extremities with subtle weakness on right side. No pronator drift. Fine motor and coordination intact and symmetric. Distal pulses (DPs) 1+ bilaterally.  Continue taking Brilinta 90 mg twice daily and Aspirin 81 mg once daily. Further plans per neurology- appreciate and agree with management. NIR to follow.   Bea Graff Vernice Bowker, PA-C 08/24/2019, 4:26 PM

## 2019-08-24 NOTE — Progress Notes (Signed)
Patient chart reviewed, discussed with RN.   PCCM available PRN if neuro or neuro-IR needs.  Erskine Emery MD PCCM

## 2019-08-24 NOTE — Anesthesia Procedure Notes (Addendum)
Arterial Line Insertion Start/End6/11/2019 6:10 AM, 08/24/2019 6:15 AM Performed by: Valetta Fuller, CRNA, CRNA  Left, radial was placed Catheter size: 20 G Hand hygiene performed  and maximum sterile barriers used   Attempts: 1 Procedure performed without using ultrasound guided technique. Following insertion, dressing applied and Biopatch. Post procedure assessment: normal  Additional procedure comments: Art line placed per Arther Dames, CRNA.

## 2019-08-25 ENCOUNTER — Inpatient Hospital Stay (HOSPITAL_COMMUNITY): Payer: Medicare Other

## 2019-08-25 ENCOUNTER — Encounter (HOSPITAL_COMMUNITY): Payer: Self-pay | Admitting: Interventional Radiology

## 2019-08-25 DIAGNOSIS — I6389 Other cerebral infarction: Secondary | ICD-10-CM

## 2019-08-25 LAB — CBC WITH DIFFERENTIAL/PLATELET
Abs Immature Granulocytes: 0.05 10*3/uL (ref 0.00–0.07)
Basophils Absolute: 0 10*3/uL (ref 0.0–0.1)
Basophils Relative: 0 %
Eosinophils Absolute: 0.1 10*3/uL (ref 0.0–0.5)
Eosinophils Relative: 1 %
HCT: 37.8 % — ABNORMAL LOW (ref 39.0–52.0)
Hemoglobin: 12.6 g/dL — ABNORMAL LOW (ref 13.0–17.0)
Immature Granulocytes: 1 %
Lymphocytes Relative: 13 %
Lymphs Abs: 1.2 10*3/uL (ref 0.7–4.0)
MCH: 29.9 pg (ref 26.0–34.0)
MCHC: 33.3 g/dL (ref 30.0–36.0)
MCV: 89.6 fL (ref 80.0–100.0)
Monocytes Absolute: 0.6 10*3/uL (ref 0.1–1.0)
Monocytes Relative: 6 %
Neutro Abs: 7.5 10*3/uL (ref 1.7–7.7)
Neutrophils Relative %: 79 %
Platelets: 165 10*3/uL (ref 150–400)
RBC: 4.22 MIL/uL (ref 4.22–5.81)
RDW: 13.8 % (ref 11.5–15.5)
WBC: 9.4 10*3/uL (ref 4.0–10.5)
nRBC: 0 % (ref 0.0–0.2)

## 2019-08-25 LAB — BASIC METABOLIC PANEL
Anion gap: 8 (ref 5–15)
BUN: 12 mg/dL (ref 8–23)
CO2: 21 mmol/L — ABNORMAL LOW (ref 22–32)
Calcium: 8.2 mg/dL — ABNORMAL LOW (ref 8.9–10.3)
Chloride: 111 mmol/L (ref 98–111)
Creatinine, Ser: 1.18 mg/dL (ref 0.61–1.24)
GFR calc Af Amer: >60 mL/min (ref >60)
GFR calc non Af Amer: >60 mL/min (ref >60)
Glucose, Bld: 123 mg/dL — ABNORMAL HIGH (ref 70–99)
Potassium: 3.7 mmol/L (ref 3.5–5.1)
Sodium: 140 mmol/L (ref 135–145)

## 2019-08-25 LAB — LIPID PANEL
Cholesterol: 127 mg/dL (ref 0–200)
HDL: 37 mg/dL — ABNORMAL LOW (ref >40)
LDL Cholesterol: 65 mg/dL (ref 0–99)
Total CHOL/HDL Ratio: 3.4 RATIO
Triglycerides: 127 mg/dL (ref <150)
VLDL: 25 mg/dL (ref 0–40)

## 2019-08-25 LAB — GLUCOSE, CAPILLARY
Glucose-Capillary: 116 mg/dL — ABNORMAL HIGH (ref 70–99)
Glucose-Capillary: 156 mg/dL — ABNORMAL HIGH (ref 70–99)
Glucose-Capillary: 111 mg/dL — ABNORMAL HIGH (ref 70–99)

## 2019-08-25 LAB — ECHOCARDIOGRAM COMPLETE
Height: 68 in
Weight: 4426.84 oz

## 2019-08-25 LAB — HEMOGLOBIN A1C
Hgb A1c MFr Bld: 6 % — ABNORMAL HIGH (ref 4.8–5.6)
Mean Plasma Glucose: 125.5 mg/dL

## 2019-08-25 MED ORDER — AMLODIPINE BESYLATE 10 MG PO TABS
10.0000 mg | ORAL_TABLET | Freq: Every day | ORAL | Status: DC
  Administered 2019-08-25 – 2019-08-26 (×2): 10 mg via ORAL
  Filled 2019-08-25 (×2): qty 1

## 2019-08-25 MED ORDER — DM-GUAIFENESIN ER 30-600 MG PO TB12
2.0000 | ORAL_TABLET | Freq: Two times a day (BID) | ORAL | Status: DC
  Administered 2019-08-25 – 2019-08-26 (×3): 2 via ORAL
  Filled 2019-08-25 (×4): qty 2

## 2019-08-25 MED ORDER — HYDRALAZINE HCL 25 MG PO TABS
25.0000 mg | ORAL_TABLET | Freq: Three times a day (TID) | ORAL | Status: DC
  Administered 2019-08-25 – 2019-08-26 (×4): 25 mg via ORAL
  Filled 2019-08-25 (×5): qty 1

## 2019-08-25 NOTE — CV Procedure (Signed)
2D echo attempted but consult in room. Will try later

## 2019-08-25 NOTE — Progress Notes (Signed)
Occupational Therapy Evaluation Patient Details Name: Kevin Reynolds MRN: [id number] DOB: [birth year] Today's Date: 08/25/2019    History of Present Illness 69 y.o. male with past medical history significant for hypertension presents to the emergency department with slurred speech right-sided weakness on waking up at 3:30 in the morning. NIHSS 4 on admission.  He underwent successful mechanical thrombectomy along with rescue left proximal carotid artery angioplasty and stenting.     Clinical Impression   PTA, pt independent with ADL and mobility and very active. Pt enjoys doing yardwork and going to the gym. 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 Pt with minimal unsteadiness initially, however improved as pt mobilized. Minimal complaints of dizziness - demonstrated a drop in BP 140/75 sitting; 109/51 in chair after mobility - nsg aware. Will follow up with additional visit to further assess cognition during functional tasks to facilitate safe DC home. Do not recommend any further OT services after DC.     Follow Up Recommendations  No OT follow up;Supervision - Intermittent    Equipment Recommendations  None recommended by OT    Recommendations for Other Services       Precautions / Restrictions Precautions Precautions: Fall Restrictions Weight Bearing Restrictions: No      Mobility Bed Mobility Overal bed mobility: Modified Independent                Transfers Overall transfer level: Needs assistance   Transfers: Sit to/from Stand Sit to Stand: Min guard         General transfer comment: initially minimally unsteady form being in bed; S at end of session    Balance Overall balance assessment: Needs assistance   Sitting balance-Leahy Scale: Normal       Standing balance-Leahy Scale: Fair                             ADL either performed or assessed with clinical  judgement   ADL Overall ADL's : Needs assistance/impaired     Grooming: Supervision/safety;Standing   Upper Body Bathing: Set up;Sitting   Lower Body Bathing: Supervison/ safety;Sit to/from stand   Upper Body Dressing : Supervision/safety;Sitting   Lower Body Dressing: Supervision/safety;Sit to/from stand   Toilet Transfer: Designer, fashion/clothing and Hygiene: Modified independent       Functional mobility during ADLs: Min guard General ADL Comments: minimal unsteadiness with mobility then agreed he felt much better after ambulating around unit     Vision   Vision Assessment?: No apparent visual deficits     Perception Perception Perception Tested?:  (appears [facility])   Praxis Praxis Praxis tested?: Within functional limits    Pertinent Vitals/Pain Pain Assessment: No/denies pain     Hand Dominance Right   Extremity/Trunk Assessment Upper Extremity Assessment Upper Extremity Assessment: Overall WFL for tasks assessed   Lower Extremity Assessment Lower Extremity Assessment: Defer to PT evaluation   Cervical / Trunk Assessment Cervical / Trunk Assessment: Normal   Communication Communication Communication: No difficulties (increased time required to "think of words" at times)   Cognition Arousal/Alertness: Awake/alert Behavior During Therapy: WFL for tasks assessed/performed Overall Cognitive Status: Within Functional Limits for tasks assessed                                     General Comments  wife present for session; very supportive    Exercises     Shoulder Instructions      Home Living Family/patient expects to be discharged to:: Private residence Living Arrangements: Spouse/significant other Available Help at Discharge: Available 24 hours/day Type of Home: House Home Access: Level entry     Home Layout: Multi-level;Able to live on main level with bedroom/bathroom     Bathroom Shower/Tub: Tub/shower  unit;Curtain   Bathroom Toilet: Standard Bathroom Accessibility: Yes How Accessible: Accessible via walker Home Equipment: Shower seat;Grab bars - tub/shower;Bedside commode;Hand held shower head          Prior Functioning/Environment Level of Independence: Independent        Comments: yardwork; goes to the  gym; 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; Journalist, newspaper; 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        OT Problem List: Decreased safety awareness;Obesity      OT Treatment/Interventions: Self-care/ADL training;Neuromuscular education;Therapeutic exercise;DME and/or AE instruction;Therapeutic activities;Visual/perceptual remediation/compensation;Cognitive remediation/compensation;Patient/family education;Balance training    OT Goals(Current goals can be found in the care plan section) Acute Rehab OT Goals Patient Stated Goal: to lose some weight and not have another stroke OT Goal Formulation: With patient Time For Goal Achievement: 09/08/19 Potential to Achieve Goals: Good  OT Frequency: Min 2X/week   Barriers to D/C:            Co-evaluation PT/OT/SLP Co-Evaluation/Treatment: Yes (partial eval) Reason for Co-Treatment: For patient/therapist safety   OT goals addressed during session: ADL's and self-care      AM-PAC OT "6 Clicks" Daily Activity     Outcome Measure Help from another person eating meals?: None Help from another person taking care of personal grooming?: A Little Help from another person toileting, which includes using toliet, bedpan, or urinal?: A Little Help from another person bathing (including washing, rinsing, drying)?: A Little Help from another person to put on and taking off regular upper body clothing?: A Little Help from another person to put on and taking off regular lower body clothing?: A Little 6 Click Score: 19   End of Session Equipment Utilized During Treatment: Gait belt Nurse Communication: Mobility status  Activity  Tolerance: Patient tolerated treatment well Patient left: in chair;with call bell/phone within reach;with chair alarm set;with family/visitor present  OT Visit Diagnosis: Unsteadiness on feet (R26.81)                Time: 5809-9833 OT Time Calculation (min): 51 min Charges:  OT General Charges $OT Visit: 1 Visit OT Evaluation $OT Eval Moderate Complexity: 1 Mod OT Treatments $Self Care/Home Management : 8-22 mins  Maurie Boettcher, OT/L   Acute OT Clinical Specialist Acute Rehabilitation Services Pager [phone] Office [phone]   [facility] 08/25/2019, 5:50 PM

## 2019-08-25 NOTE — Progress Notes (Signed)
Supervising Physician: Luanne Bras  Patient Status: [facility] - In-pt  Subjective: s/p cerebral arteriogram with emergent mechanical thrombectomy of left ICA and left MCA occlusions achieving a TICI 3 revascularization, along with revascularization of proximal left ICA acute occlusion using stent assisted angioplasty  Pt sitting up in bed, wife at bedside. Feels and looks good. No specific complaints.  Objective: Physical Exam: BP (!) 107/50 (BP Location: Right Arm)   Pulse 71   Temp 98.3 F (36.8 C) (Axillary)   Resp (!) 22   Ht 5\' 8"  (1.727 m)   Wt 125.5 kg   SpO2 97%   BMI 42.07 kg/m  A&O Speech clear No facial asymmetry. Tongue midline. Can spontaneously move all extremities with subtle weakness on right side. No pronator drift. Fine motor and coordination intact and symmetric. Distal pulses (DPs) 1+ bilaterally.   Current Facility-Administered Medications:  .   stroke: mapping our early stages of recovery book, , Does not apply, Once, Aroor, Sushanth R, MD .  0.9 %  sodium chloride infusion, , Intravenous, Continuous, Aroor, Lanice Schwab, MD, Last Rate: 50 mL/hr at 08/24/19 0543, New Bag at 08/24/19 0543 .  0.9 %  sodium chloride infusion, , Intravenous, Continuous, Deveshwar, Sanjeev, MD, Last Rate: 75 mL/hr at 08/25/19 0800, Rate Verify at 08/25/19 0800 .  acetaminophen (TYLENOL) tablet 650 mg, 650 mg, Oral, Q4H PRN **OR** acetaminophen (TYLENOL) 160 MG/5ML solution 650 mg, 650 mg, Per Tube, Q4H PRN **OR** acetaminophen (TYLENOL) suppository 650 mg, 650 mg, Rectal, Q4H PRN, Deveshwar, Sanjeev, MD .  amLODipine (NORVASC) tablet 10 mg, 10 mg, Oral, Daily, Candee Furbish, MD .  aspirin chewable tablet 81 mg, 81 mg, Oral, Daily **OR** aspirin chewable tablet 81 mg, 81 mg, Per Tube, Daily, Deveshwar, Sanjeev, MD .  atorvastatin (LIPITOR) tablet 40 mg, 40 mg, Per Tube, Daily, Biby, Sharon L, NP, 40 mg at 08/24/19 1436 .  chlorhexidine (PERIDEX) 0.12 % solution 15 mL,  15 mL, Mouth Rinse, BID, Aroor, Karena Addison R, MD, 15 mL at 08/24/19 1126 .  Chlorhexidine Gluconate Cloth 2 % PADS 6 each, 6 each, Topical, Q0600, Aroor, Lanice Schwab, MD, 6 each at 08/25/19 0511 .  clevidipine (CLEVIPREX) infusion 0.5 mg/mL, 0-21 mg/hr, Intravenous, Continuous, Deveshwar, Sanjeev, MD, Last Rate: 34 mL/hr at 08/25/19 0805, 17 mg/hr at 08/25/19 0805 .  dextromethorphan-guaiFENesin (MUCINEX DM) 30-600 MG per 12 hr tablet 2 tablet, 2 tablet, Oral, BID, Candee Furbish, MD .  enoxaparin (LOVENOX) injection 60 mg, 60 mg, Subcutaneous, Q24H, Dang, Thuy D, RPH, 60 mg at 08/24/19 2011 .  hydrALAZINE (APRESOLINE) injection 20 mg, 20 mg, Intravenous, Q6H PRN, Garvin Fila, MD, 20 mg at 08/25/19 0804 .  hydrALAZINE (APRESOLINE) tablet 25 mg, 25 mg, Oral, TID, Candee Furbish, MD .  labetalol (NORMODYNE) injection 20 mg, 20 mg, Intravenous, Q2H PRN, Garvin Fila, MD, 20 mg at 08/24/19 1811 .  MEDLINE mouth rinse, 15 mL, Mouth Rinse, q12n4p, Aroor, Karena Addison R, MD, 15 mL at 08/24/19 1551 .  senna-docusate (Senokot-S) tablet 1 tablet, 1 tablet, Oral, QHS PRN, Aroor, Karena Addison R, MD .  ticagrelor (BRILINTA) tablet 90 mg, 90 mg, Oral, BID **OR** ticagrelor (BRILINTA) tablet 90 mg, 90 mg, Per Tube, BID, Deveshwar, Sanjeev, MD, 90 mg at 08/24/19 2010  Labs: CBC Recent Labs    08/24/19 0547 08/24/19 0547 08/24/19 0556 08/25/19 0440  WBC 5.9  --   --  9.4  HGB 16.0   < > 16.7 12.6*  HCT  48.9   < > 49.0 37.8*  PLT 193  --   --  165   < > = values in this interval not displayed.   BMET Recent Labs    08/24/19 0435 08/24/19 0435 08/24/19 0556 08/25/19 0440  NA 138   < > 140 140  K 3.7   < > 4.2 3.7  CL 108   < > 103 111  CO2 21*  --   --  21*  GLUCOSE 137*   < > 129* 123*  BUN 18   < > 25* 12  CREATININE 1.27*   < > 1.20 1.18  CALCIUM 9.0  --   --  8.2*   < > = values in this interval not displayed.   LFT Recent Labs    08/24/19 0435  PROT 6.9  ALBUMIN 3.9  AST 30  ALT 25    ALKPHOS 56  BILITOT 0.7   PT/INR Recent Labs    08/24/19 0435 08/24/19 0547  LABPROT 14.6 13.1  INR 1.2 1.0     Studies/Results: CT ANGIO HEAD W OR WO CONTRAST  Result Date: 08/24/2019 CLINICAL DATA:  Right arm weakness and slurred speech EXAM: CT ANGIOGRAPHY HEAD AND NECK TECHNIQUE: Multidetector CT imaging of the head and neck was performed using the standard protocol during bolus administration of intravenous contrast. Multiplanar CT image reconstructions and MIPs were obtained to evaluate the vascular anatomy. Carotid stenosis measurements (when applicable) are obtained utilizing NASCET criteria, using the distal internal carotid diameter as the denominator. CONTRAST:  85mL OMNIPAQUE IOHEXOL 350 MG/ML SOLN COMPARISON:  None. FINDINGS: CTA NECK FINDINGS Aortic arch: 2 vessel branching.  No acute finding. Right carotid system: Moderate calcified plaque at the bulb without flow limiting stenosis or ulceration. Left carotid system: Calcified plaque at the bulb which is occluded. No flow seen in the left ICA. Vertebral arteries: No proximal subclavian stenosis. Slight left vertebral artery dominance. The vertebral arteries appear smooth and widely patent to the dura Skeleton: No acute or aggressive finding. Other neck: No acute finding Upper chest: Negative Review of the MIP images confirms the above findings CTA HEAD FINDINGS Anterior circulation: Intermittent flow in the left ICA at the skull base with final reconstitution at the supraclinoid segment. There is a hypoplastic left A1 segment. Present left posterior communicating artery. Along the anterior and inferior aspect of the M1 segment is a extrinsic filling defect appearance suggesting nonocclusive thrombus at this level. Allowing for quality of bolus density no downstream branch occlusion is seen. Posterior circulation: The vertebral and basilar arteries are smooth and widely patent. No branch occlusion or aneurysm. Venous sinuses: Negative  Anatomic variants: Negative Critical Value/emergent results were called by telephone at the time of interpretation on 08/24/2019 at 4:51 am to provider Samara Snide , who verbally acknowledged these results. Review of the MIP images confirms the above findings IMPRESSION: 1. Left ICA occlusion at the bulb with supraclinoid reconstitution. There is a left posterior communicating artery and hypoplastic A1 segment. 2. Nonocclusive thrombus at the left M1 segment. Electronically Signed   By: Monte Fantasia M.D.   On: 08/24/2019 04:58   DG Chest 2 View  Result Date: 08/24/2019 CLINICAL DATA:  Acute CVA EXAM: CHEST - 2 VIEW COMPARISON:  July 01, 2012. FINDINGS: Feeding tube tip is below the diaphragm. Lungs are clear. Heart size and pulmonary vascularity are normal. No adenopathy. No bone lesions. IMPRESSION: Lungs clear. Cardiac silhouette normal. Feeding tube tip below diaphragm. Electronically Signed  By: Lowella Grip III M.D.   On: 08/24/2019 13:46   CT ANGIO NECK W OR WO CONTRAST  Result Date: 08/24/2019 CLINICAL DATA:  Right arm weakness and slurred speech EXAM: CT ANGIOGRAPHY HEAD AND NECK TECHNIQUE: Multidetector CT imaging of the head and neck was performed using the standard protocol during bolus administration of intravenous contrast. Multiplanar CT image reconstructions and MIPs were obtained to evaluate the vascular anatomy. Carotid stenosis measurements (when applicable) are obtained utilizing NASCET criteria, using the distal internal carotid diameter as the denominator. CONTRAST:  26mL OMNIPAQUE IOHEXOL 350 MG/ML SOLN COMPARISON:  None. FINDINGS: CTA NECK FINDINGS Aortic arch: 2 vessel branching.  No acute finding. Right carotid system: Moderate calcified plaque at the bulb without flow limiting stenosis or ulceration. Left carotid system: Calcified plaque at the bulb which is occluded. No flow seen in the left ICA. Vertebral arteries: No proximal subclavian stenosis. Slight left vertebral  artery dominance. The vertebral arteries appear smooth and widely patent to the dura Skeleton: No acute or aggressive finding. Other neck: No acute finding Upper chest: Negative Review of the MIP images confirms the above findings CTA HEAD FINDINGS Anterior circulation: Intermittent flow in the left ICA at the skull base with final reconstitution at the supraclinoid segment. There is a hypoplastic left A1 segment. Present left posterior communicating artery. Along the anterior and inferior aspect of the M1 segment is a extrinsic filling defect appearance suggesting nonocclusive thrombus at this level. Allowing for quality of bolus density no downstream branch occlusion is seen. Posterior circulation: The vertebral and basilar arteries are smooth and widely patent. No branch occlusion or aneurysm. Venous sinuses: Negative Anatomic variants: Negative Critical Value/emergent results were called by telephone at the time of interpretation on 08/24/2019 at 4:51 am to provider Samara Snide , who verbally acknowledged these results. Review of the MIP images confirms the above findings IMPRESSION: 1. Left ICA occlusion at the bulb with supraclinoid reconstitution. There is a left posterior communicating artery and hypoplastic A1 segment. 2. Nonocclusive thrombus at the left M1 segment. Electronically Signed   By: Monte Fantasia M.D.   On: 08/24/2019 04:58   MR BRAIN WO CONTRAST  Result Date: 08/24/2019 CLINICAL DATA:  Follow-up stroke. Left ICA occlusion with supraclinoid reconstitution. Left M1 thrombus. EXAM: MRI HEAD WITHOUT CONTRAST TECHNIQUE: Multiplanar, multiecho pulse sequences of the brain and surrounding structures were obtained without intravenous contrast. COMPARISON:  CT studies done earlier today. FINDINGS: Brain: Diffusion imaging shows acute infarction affecting posterior aspect of the left putamen, midportion of the left caudate body, punctate involvement of the deep insular cortex and smudgy involvement  of the left frontoparietal junction subcortical white matter. No large confluent infarction. Elsewhere, the brainstem and cerebellum are normal. No old infarction seen elsewhere. No mass, hemorrhage, hydrocephalus or extra-axial collection. Vascular: Major vessels at the base of the brain show flow. Skull and upper cervical spine: Negative Sinuses/Orbits: Clear/normal Other: None IMPRESSION: Acute infarction on the left affecting the posterior putamen, midportion of the caudate body, punctate involvement of the deep insular cortex and smudgy involvement of the frontoparietal junction subcortical white matter. No mass effect or hemorrhage. Electronically Signed   By: Nelson Chimes M.D.   On: 08/24/2019 13:28   CT CEREBRAL PERFUSION W CONTRAST  Result Date: 08/24/2019 CLINICAL DATA:  Large vessel occlusion. Stroke scale has improved 0 currently EXAM: CT PERFUSION BRAIN TECHNIQUE: Multiphase CT imaging of the brain was performed following IV bolus contrast injection. Subsequent parametric perfusion maps were calculated using  RAPID software. CONTRAST:  67mL OMNIPAQUE IOHEXOL 350 MG/ML SOLN COMPARISON:  CT and CTA from earlier today FINDINGS: CT Brain Perfusion Findings: CBF (<30%) Volume: 2mL Perfusion (Tmax>6.0s) volume: 42mL Mismatch Volume: 74mL ASPECTS on noncontrast CT Head: 10 at 4:35 a.m. today. Infarct Core: 0 mL IMPRESSION: 12 cc of penumbra in the left frontal parietal region. No evidence of core infarct. Electronically Signed   By: Monte Fantasia M.D.   On: 08/24/2019 05:04   CT HEAD CODE STROKE WO CONTRAST  Result Date: 08/24/2019 CLINICAL DATA:  Code stroke. Ataxia with stroke suspected. Slurred speech EXAM: CT HEAD WITHOUT CONTRAST TECHNIQUE: Contiguous axial images were obtained from the base of the skull through the vertex without intravenous contrast. COMPARISON:  None. FINDINGS: Brain: No evidence of acute infarction, hemorrhage, hydrocephalus, extra-axial collection or mass lesion/mass effect.  Vascular: Dense left proximal MCA Skull: Negative Sinuses/Orbits: Negative Other: These results were communicated to Dr. Lorraine Lax at 4:37 amon 6/9/2021by text page via the [facility] messaging system. CTA is already pending ASPECTS [facility] Stroke Program Early CT Score) - Ganglionic level infarction (caudate, lentiform nuclei, internal capsule, insula, M1-M3 cortex): 7 - Supraganglionic infarction (M4-M6 cortex): 3 Total score (0-10 with 10 being normal): 10 IMPRESSION: Dense left MCA with no visible infarct. No intracranial hemorrhage. ASPECTS is 10. Electronically Signed   By: Monte Fantasia M.D.   On: 08/24/2019 04:38    Assessment/Plan: CVA s/p cerebral arteriogram with emergent mechanical thrombectomy of left ICA and left MCA occlusions achieving a TICI 3 revascularization, along with revascularization of proximal left ICA acute occlusion using stent assisted angioplasty. Cont ASA and Brilinta Reviewed procedure with pt and wife as well as answered numerous questions. Will set up 2 week follow up appointment.     LOS: 1 day   I spent a total of 15 minutes in face to face in clinical consultation, greater than 50% of which was counseling/coordinating care for CVA post thrombectomy stent angioplasty  Ascencion Dike PA-C 08/25/2019 10:17 AM

## 2019-08-25 NOTE — Progress Notes (Signed)
  Echocardiogram 2D Echocardiogram has been performed.  Bobbye Charleston 08/25/2019, 3:25 PM

## 2019-08-25 NOTE — Evaluation (Signed)
Physical Therapy Evaluation Patient Details Name: Kevin Reynolds MRN: [id number] DOB: 26-Dec-1950 Today's Date: 08/25/2019   History of Present Illness  69 y.o. male with past medical history significant for hypertension presents to the emergency department with slurred speech right-sided weakness on waking up at 3:30 in the morning. NIHSS 4 on admission.  He underwent successful mechanical thrombectomy along with rescue left proximal carotid artery angioplasty and stenting.    Clinical Impression  Pt admitted with/for Right sided weakness with revascularization.  Pt is likely closing in on baseline, but still shows mild gait disturbance..  Pt currently limited functionally due to the problems listed below.  (see problems list.)  Pt will benefit from PT to maximize function and safety to be able to get home safely with available assist.     Follow Up Recommendations No PT follow up    Equipment Recommendations  None recommended by PT    Recommendations for Other Services       Precautions / Restrictions Precautions Precautions: Fall Restrictions Weight Bearing Restrictions: No      Mobility  Bed Mobility Overal bed mobility: Modified Independent                Transfers Overall transfer level: Needs assistance   Transfers: Sit to/from Stand Sit to Stand: Min guard         General transfer comment: initially minimally unsteady form being in bed; S at end of session  Ambulation/Gait Ambulation/Gait assistance: Min guard Gait Distance (Feet): 160 Feet Assistive device: None Gait Pattern/deviations: Step-through pattern;Decreased stride length Gait velocity: moderate Gait velocity interpretation: 1.31 - 2.62 ft/sec, indicative of limited community ambulator General Gait Details: generally steady, but tentative when scanning L/R repetitively.  Some mild deviation with stepping over visual obstacles.  Able to mildly increase speed to cuing, but moderate speed and  insignificant changes at best.  Stairs            Wheelchair Mobility    Modified Rankin (Stroke Patients Only) Modified Rankin (Stroke Patients Only) Pre-Morbid Rankin Score: No symptoms Modified Rankin: Slight disability     Balance Overall balance assessment: Needs assistance   Sitting balance-Leahy Scale: Normal       Standing balance-Leahy Scale: Fair                               Pertinent Vitals/Pain Pain Assessment: No/denies pain    Home Living Family/patient expects to be discharged to:: Private residence Living Arrangements: Spouse/significant other Available Help at Discharge: Available 24 hours/day Type of Home: House Home Access: Level entry     Home Layout: Multi-level;Able to live on main level with bedroom/bathroom Home Equipment: Shower seat;Grab bars - tub/shower;Bedside commode;Hand held shower head      Prior Function Level of Independence: Independent         Comments: yardwork; goes to the  gym; 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; Journalist, newspaper; 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     Hand Dominance   Dominant Hand: Right    Extremity/Trunk Assessment   Upper Extremity Assessment Upper Extremity Assessment: Overall WFL for tasks assessed    Lower Extremity Assessment Lower Extremity Assessment: Overall WFL for tasks assessed    Cervical / Trunk Assessment Cervical / Trunk Assessment: Normal  Communication   Communication: No difficulties (increased time required to "think of words" at times)  Cognition Arousal/Alertness: Awake/alert Behavior During Therapy: WFL for tasks assessed/performed Overall Cognitive Status:  Within Functional Limits for tasks assessed                                        General Comments General comments (skin integrity, edema, etc.): wife present and supportive    Exercises     Assessment/Plan    PT Assessment Patient needs continued PT services   PT Problem List Decreased balance;Decreased mobility;Impaired sensation;Decreased coordination       PT Treatment Interventions Gait training;Stair training;Functional mobility training;Therapeutic activities;Balance training;Neuromuscular re-education;Patient/family education    PT Goals (Current goals can be found in the Care Plan section)  Acute Rehab PT Goals Patient Stated Goal: to lose some weight and not have another stroke PT Goal Formulation: With patient Time For Goal Achievement: 09/01/19 Potential to Achieve Goals: Good    Frequency Min 3X/week   Barriers to discharge        Co-evaluation               AM-PAC PT "6 Clicks" Mobility  Outcome Measure Help needed turning from your back to your side while in a flat bed without using bedrails?: None Help needed moving from lying on your back to sitting on the side of a flat bed without using bedrails?: None Help needed moving to and from a bed to a chair (including a wheelchair)?: None Help needed standing up from a chair using your arms (e.g., wheelchair or bedside chair)?: None Help needed to walk in hospital room?: A Little Help needed climbing 3-5 steps with a railing? : A Little 6 Click Score: 22    End of Session   Activity Tolerance: Patient tolerated treatment well Patient left: in chair;with family/visitor present;with call bell/phone within reach Nurse Communication: Mobility status PT Visit Diagnosis: Unsteadiness on feet (R26.81);Other symptoms and signs involving the nervous system (E32.122)    Time: 4825-0037 PT Time Calculation (min) (ACUTE ONLY): 51 min   Charges:              08/25/2019  [name]., PT Acute Rehabilitation Services [phone]  (pager) [phone]  (office)  [name] 08/25/2019, 11:34 PM

## 2019-08-25 NOTE — Progress Notes (Signed)
STROKE TEAM PROGRESS NOTE   INTERVAL HISTORY0 His wife is at the bedside.   He is doing very well.  His speech and language have improved almost back to baseline.  He did pass RN bedside swallow eval.  He is often nicardipine drip.  MRI scan of the brain yesterday showed very tiny patchy left MCA branch infarcts without any hemorrhage.  Echocardiogram is pending.  Therapy evaluation is pending.  Patient did participate in the sleep smart study and tested positive for sleep apnea on the overnight Knox 3 monitor Vitals:   08/25/19 1142 08/25/19 1200 08/25/19 1245 08/25/19 1300  BP:  (!) 105/55  (!) 110/56  Pulse:  75 82 81  Resp:  (!) 21 (!) 23 19  Temp: 98.4 F (36.9 C)     TempSrc: Axillary     SpO2:  97% 95% 97%  Weight:      Height:        CBC:  Recent Labs  Lab 08/24/19 0547 08/24/19 0547 08/24/19 0556 08/25/19 0440  WBC 5.9  --   --  9.4  NEUTROABS 3.3  --   --  7.5  HGB 16.0   < > 16.7 12.6*  HCT 48.9   < > 49.0 37.8*  MCV 89.7  --   --  89.6  PLT 193  --   --  165   < > = values in this interval not displayed.    Basic Metabolic Panel:  Recent Labs  Lab 08/24/19 0435 08/24/19 0435 08/24/19 0556 08/25/19 0440  NA 138   < > 140 140  K 3.7   < > 4.2 3.7  CL 108   < > 103 111  CO2 21*  --   --  21*  GLUCOSE 137*   < > 129* 123*  BUN 18   < > 25* 12  CREATININE 1.27*   < > 1.20 1.18  CALCIUM 9.0  --   --  8.2*   < > = values in this interval not displayed.   Lipid Panel:     Component Value Date/Time   CHOL 127 08/25/2019 0440   TRIG 127 08/25/2019 0440   HDL 37 (L) 08/25/2019 0440   CHOLHDL 3.4 08/25/2019 0440   VLDL 25 08/25/2019 0440   LDLCALC 65 08/25/2019 0440   HgbA1c:  Lab Results  Component Value Date   HGBA1C 6.0 (H) 08/25/2019   Urine Drug Screen:     Component Value Date/Time   LABOPIA NONE DETECTED 08/24/2019 0520   COCAINSCRNUR NONE DETECTED 08/24/2019 0520   LABBENZ NONE DETECTED 08/24/2019 0520   AMPHETMU NONE DETECTED 08/24/2019  0520   THCU NONE DETECTED 08/24/2019 0520   LABBARB NONE DETECTED 08/24/2019 0520    Alcohol Level     Component Value Date/Time   ETH <10 08/24/2019 0435    IMAGING past 24 hours No results found.  PHYSICAL EXAM Pleasant middle-age obese African-American male not in distress. . Afebrile. Head is nontraumatic. Neck is supple without bruit.    Cardiac exam no murmur or gallop. Lungs are clear to auscultation. Distal pulses are well felt. Neurological Exam : Awake alert oriented to time place and person.  Fluent speech without any paraphasic errors or hesitancy.  Good comprehension, naming repetition.  No dysarthria.  Extraocular movements are full range without nystagmus.  Blinks to threat bilaterally.  Face is symmetric tongue midline.  Motor system exam no upper or lower extremity drift.  Symmetric equal strength in all  4 extremities.  Diminished fine finger movements on the right.  Orbits left over right upper extremity.  Symmetric lower extremity strength.  Sensation appears intact bilaterally.  Coordination is slow but accurate.  Gait not tested.  NIH stroke scale 0 premorbid modified Rankin score 0  ASSESSMENT/PLAN Kevin Reynolds is a 69 y.o. male with history of hypertension presenting with right-sided weakness, slurred speech and right facial droop.  No TPA given out of window.  CT with L ICA occlusion and left M1 nonocclusive thrombus.  Taken to IR.  Stroke:   L MCA infarct s/p L ICA and MCA revascularization w/ L ICA stent following angioplasty, infarct likely embolic secondary to unknown source  Code Stroke CT head dense left MCA.  ASPECTS 10.     CTA head & neck left ICA occlusion with supraclinoid reconstitution.  Left M1 M1 nonocclusive thrombus.  CT perfusion left frontal parietal 12 cc penumbra.  No core infarct.  Cerebral angiogram occluded L ICA and L MCA with TICI3 revascularization using penumbra and aspiration, s/p stent assisted angioplasty of occluded L ICA  proximally with complete revascularization. Treated with cangrelor at time of angioplasty.  Post IR CT no ICH or mass-effect  MRI left posterior putamen, midportion caudate body, punctate deep insular cortex and frontoparietal junction subcortical white matter infarcts  2D Echo pending  LDL 65 mg percent HgbA1c 6.0  SCDs for VTE prophylaxis. Add lovenox  No antithrombotic prior to admission, now on aspirin 81 mg daily and Brilinta (ticagrelor) 90 mg bid following aspirin load and cangrelor bolus and infusion.  (Co Pay $47)  Therapy recommendations: pending   Disposition:  pending   Hypertension  Home meds: Norvasc 10  On Cleviprex  . BP goal per IR x 24h following IR procedure  . Stable  . Long-term BP goal normotensive  Hyperlipidemia  Home meds:  No statin  Add lipitor 40  LDL pending, goal < 70  Continue statin at discharge  Dysphagia . Currently NPO . coretrak placed in PACU . Anticipate he will be able to swallow . Check swallow screen   Other Stroke Risk Factors  Advanced age  Former cigarette smoker  ETOH use, alcohol level <10, advised to drink no more than 2 drink(s) a day  Morbid Obesity, Body mass index is 42.07 kg/m., recommend weight loss, diet and exercise as appropriate   Other Active Problems  Allergic rhinitis on Claritin and Nasonex prior to admission  Hospital day # 1 He presented with speech difficulties and right-sided weakness with fluctuating exam and was found to have proximal left carotid occlusion with distal embolization with a salvageable penumbra.  He was treated with emergent mechanical thrombectomy requiring rescue angioplasty and stenting of the left proximal carotid.  He seems to have done well .  Continue aspirin and Brilinta for his stent.     Check echocardiogram   and aggressive risk factor modification. . Patient is participating the sleep smart study and tested positive on Knox 3 monitor.  He will undergo CPAP mask  tolerability test tonight.  Mobilize out of bed.  Physical occupational therapy consults.  Transfer to neurology floor bed.  Discussed with patient and wife and answered questions.  Greater than 50% time during this 35-minute visit was spent on counseling and coordination of care about his stroke and carotid occlusion and stent and sleep apnea and answering questions    Antony Contras, MD  To contact Stroke Continuity provider, please refer to [email or web address] After hours, contact General  Neurology

## 2019-08-25 NOTE — Progress Notes (Signed)
Patient has arrived to unit via wheelchair. Tele connected and reporting no pain. All belongings are with the patient. Call bell and telephone is within the patient's reach.

## 2019-08-26 DIAGNOSIS — E785 Hyperlipidemia, unspecified: Secondary | ICD-10-CM | POA: Diagnosis present

## 2019-08-26 MED ORDER — ASPIRIN 81 MG PO CHEW: 81.0000 mg | CHEWABLE_TABLET | Freq: Every day | ORAL | Status: AC

## 2019-08-26 MED ORDER — ATORVASTATIN CALCIUM 40 MG PO TABS: 40.0000 mg | ORAL_TABLET | Freq: Every day | ORAL | Status: DC

## 2019-08-26 MED ORDER — TICAGRELOR 90 MG PO TABS: 90.0000 mg | ORAL_TABLET | Freq: Two times a day (BID) | ORAL | 2 refills | Status: DC

## 2019-08-26 MED ORDER — ATORVASTATIN CALCIUM 40 MG PO TABS
40.0000 mg | ORAL_TABLET | Freq: Once | ORAL | Status: AC
  Administered 2019-08-26: 40 mg via ORAL
  Filled 2019-08-26: qty 1

## 2019-08-26 MED ORDER — HYDRALAZINE HCL 25 MG PO TABS: 25.0000 mg | ORAL_TABLET | Freq: Three times a day (TID) | ORAL | 2 refills | Status: DC

## 2019-08-26 NOTE — Discharge Summary (Addendum)
Stroke Discharge Summary  Patient ID: Kevin Reynolds   MRN: [id number]      DOB: Jun 03, 1950  Date of Admission: 08/24/2019 Date of Discharge: 08/26/2019  Attending Physician:  Garvin Fila, MD, Stroke MD Consultant(s):     Olene Craven) Estanislado Pandy, MD (Interventional Neuroradiologist)  Patient's PCP:  Foye Spurling, MD (Inactive)  DISCHARGE DIAGNOSIS:  Principal Problem:   Acute ischemic left MCA stroke ([place]) s/p IR w/ L ICA stent placement :L MCA infarct s/p L ICA and MCA revascularization w/ L ICA stent following angioplasty, infarct likely artery to artery embolus secondary to ICA stenosis  Active Problems:   HTN (hypertension)   Seasonal allergies   Middle cerebral artery embolism, left   Hyperlipidemia   Morbid obesity ([place])   Allergies as of 08/26/2019   No Known Allergies      Medication List     STOP taking these medications    Advil 200 MG tablet Generic drug: ibuprofen   cyclobenzaprine 10 MG tablet Commonly known as: FLEXERIL       TAKE these medications    acetaminophen 500 MG tablet Commonly known as: TYLENOL Take 500-1,000 mg by mouth every 4 (four) hours as needed for mild pain or headache (or dental pain).   amLODipine 10 MG tablet Commonly known as: NORVASC Take 10 mg by mouth daily.   aspirin 81 MG chewable tablet Chew 1 tablet (81 mg total) by mouth daily. Start taking on: August 27, 2019   augmented betamethasone dipropionate 0.05 % cream Commonly known as: DIPROLENE-AF Apply 1 application topically 2 (two) times daily as needed (to itchy or irritated sites).   hydrALAZINE 25 MG tablet Commonly known as: APRESOLINE Take 1 tablet (25 mg total) by mouth 3 (three) times daily.   loratadine 10 MG tablet Commonly known as: CLARITIN Take 10 mg by mouth daily as needed for allergies.   mometasone 50 MCG/ACT nasal spray Commonly known as: NASONEX Place 2 sprays into the nose daily as needed (for allergies).   rosuvastatin 10 MG  tablet Commonly known as: CRESTOR Take 10 mg by mouth at bedtime.   ticagrelor 90 MG Tabs tablet Commonly known as: BRILINTA Take 1 tablet (90 mg total) by mouth 2 (two) times daily.   Xiidra 5 % Soln Generic drug: Lifitegrast Place 1 drop into both eyes in the morning and at bedtime.        LABORATORY STUDIES CBC    Component Value Date/Time   WBC 9.4 08/25/2019 0440   RBC 4.22 08/25/2019 0440   HGB 12.6 (L) 08/25/2019 0440   HCT 37.8 (L) 08/25/2019 0440   PLT 165 08/25/2019 0440   MCV 89.6 08/25/2019 0440   MCH 29.9 08/25/2019 0440   MCHC 33.3 08/25/2019 0440   RDW 13.8 08/25/2019 0440   LYMPHSABS 1.2 08/25/2019 0440   MONOABS 0.6 08/25/2019 0440   EOSABS 0.1 08/25/2019 0440   BASOSABS 0.0 08/25/2019 0440   CMP    Component Value Date/Time   NA 140 08/25/2019 0440   K 3.7 08/25/2019 0440   CL 111 08/25/2019 0440   CO2 21 (L) 08/25/2019 0440   GLUCOSE 123 (H) 08/25/2019 0440   BUN 12 08/25/2019 0440   CREATININE 1.18 08/25/2019 0440   CALCIUM 8.2 (L) 08/25/2019 0440   PROT 6.9 08/24/2019 0435   ALBUMIN 3.9 08/24/2019 0435   AST 30 08/24/2019 0435   ALT 25 08/24/2019 0435   ALKPHOS 56 08/24/2019 0435   BILITOT 0.7  08/24/2019 0435   GFRNONAA >60 08/25/2019 0440   GFRAA >60 08/25/2019 0440   COAGS Lab Results  Component Value Date   INR 1.0 08/24/2019   INR 1.2 08/24/2019   Lipid Panel    Component Value Date/Time   CHOL 127 08/25/2019 0440   TRIG 127 08/25/2019 0440   HDL 37 (L) 08/25/2019 0440   CHOLHDL 3.4 08/25/2019 0440   VLDL 25 08/25/2019 0440   LDLCALC 65 08/25/2019 0440   HgbA1C  Lab Results  Component Value Date   HGBA1C 6.0 (H) 08/25/2019   Urinalysis    Component Value Date/Time   COLORURINE STRAW (A) 08/24/2019 0520   APPEARANCEUR CLEAR 08/24/2019 0520   LABSPEC 1.024 08/24/2019 0520   PHURINE 7.0 08/24/2019 0520   GLUCOSEU NEGATIVE 08/24/2019 0520   HGBUR NEGATIVE 08/24/2019 0520   BILIRUBINUR NEGATIVE 08/24/2019 0520    KETONESUR NEGATIVE 08/24/2019 0520   PROTEINUR NEGATIVE 08/24/2019 0520   NITRITE NEGATIVE 08/24/2019 0520   LEUKOCYTESUR NEGATIVE 08/24/2019 0520   Urine Drug Screen     Component Value Date/Time   LABOPIA NONE DETECTED 08/24/2019 0520   COCAINSCRNUR NONE DETECTED 08/24/2019 0520   LABBENZ NONE DETECTED 08/24/2019 0520   AMPHETMU NONE DETECTED 08/24/2019 0520   THCU NONE DETECTED 08/24/2019 0520   LABBARB NONE DETECTED 08/24/2019 0520    Alcohol Level    Component Value Date/Time   ETH <10 08/24/2019 0435    SIGNIFICANT DIAGNOSTIC STUDIES CT ANGIO HEAD W OR WO CONTRAST  Result Date: 08/24/2019 CLINICAL DATA:  Right arm weakness and slurred speech EXAM: CT ANGIOGRAPHY HEAD AND NECK TECHNIQUE: Multidetector CT imaging of the head and neck was performed using the standard protocol during bolus administration of intravenous contrast. Multiplanar CT image reconstructions and MIPs were obtained to evaluate the vascular anatomy. Carotid stenosis measurements (when applicable) are obtained utilizing NASCET criteria, using the distal internal carotid diameter as the denominator. CONTRAST:  85mL OMNIPAQUE IOHEXOL 350 MG/ML SOLN COMPARISON:  None. FINDINGS: CTA NECK FINDINGS Aortic arch: 2 vessel branching.  No acute finding. Right carotid system: Moderate calcified plaque at the bulb without flow limiting stenosis or ulceration. Left carotid system: Calcified plaque at the bulb which is occluded. No flow seen in the left ICA. Vertebral arteries: No proximal subclavian stenosis. Slight left vertebral artery dominance. The vertebral arteries appear smooth and widely patent to the dura Skeleton: No acute or aggressive finding. Other neck: No acute finding Upper chest: Negative Review of the MIP images confirms the above findings CTA HEAD FINDINGS Anterior circulation: Intermittent flow in the left ICA at the skull base with final reconstitution at the supraclinoid segment. There is a hypoplastic left A1  segment. Present left posterior communicating artery. Along the anterior and inferior aspect of the M1 segment is a extrinsic filling defect appearance suggesting nonocclusive thrombus at this level. Allowing for quality of bolus density no downstream branch occlusion is seen. Posterior circulation: The vertebral and basilar arteries are smooth and widely patent. No branch occlusion or aneurysm. Venous sinuses: Negative Anatomic variants: Negative Critical Value/emergent results were called by telephone at the time of interpretation on 08/24/2019 at 4:51 am to provider Samara Snide , who verbally acknowledged these results. Review of the MIP images confirms the above findings IMPRESSION: 1. Left ICA occlusion at the bulb with supraclinoid reconstitution. There is a left posterior communicating artery and hypoplastic A1 segment. 2. Nonocclusive thrombus at the left M1 segment. Electronically Signed   By: Neva Seat.D.  On: 08/24/2019 04:58   DG Chest 2 View  Result Date: 08/24/2019 CLINICAL DATA:  Acute CVA EXAM: CHEST - 2 VIEW COMPARISON:  July 01, 2012. FINDINGS: Feeding tube tip is below the diaphragm. Lungs are clear. Heart size and pulmonary vascularity are normal. No adenopathy. No bone lesions. IMPRESSION: Lungs clear. Cardiac silhouette normal. Feeding tube tip below diaphragm. Electronically Signed   By: Lowella Grip III M.D.   On: 08/24/2019 13:46   CT ANGIO NECK W OR WO CONTRAST  Result Date: 08/24/2019 CLINICAL DATA:  Right arm weakness and slurred speech EXAM: CT ANGIOGRAPHY HEAD AND NECK TECHNIQUE: Multidetector CT imaging of the head and neck was performed using the standard protocol during bolus administration of intravenous contrast. Multiplanar CT image reconstructions and MIPs were obtained to evaluate the vascular anatomy. Carotid stenosis measurements (when applicable) are obtained utilizing NASCET criteria, using the distal internal carotid diameter as the denominator.  CONTRAST:  31mL OMNIPAQUE IOHEXOL 350 MG/ML SOLN COMPARISON:  None. FINDINGS: CTA NECK FINDINGS Aortic arch: 2 vessel branching.  No acute finding. Right carotid system: Moderate calcified plaque at the bulb without flow limiting stenosis or ulceration. Left carotid system: Calcified plaque at the bulb which is occluded. No flow seen in the left ICA. Vertebral arteries: No proximal subclavian stenosis. Slight left vertebral artery dominance. The vertebral arteries appear smooth and widely patent to the dura Skeleton: No acute or aggressive finding. Other neck: No acute finding Upper chest: Negative Review of the MIP images confirms the above findings CTA HEAD FINDINGS Anterior circulation: Intermittent flow in the left ICA at the skull base with final reconstitution at the supraclinoid segment. There is a hypoplastic left A1 segment. Present left posterior communicating artery. Along the anterior and inferior aspect of the M1 segment is a extrinsic filling defect appearance suggesting nonocclusive thrombus at this level. Allowing for quality of bolus density no downstream branch occlusion is seen. Posterior circulation: The vertebral and basilar arteries are smooth and widely patent. No branch occlusion or aneurysm. Venous sinuses: Negative Anatomic variants: Negative Critical Value/emergent results were called by telephone at the time of interpretation on 08/24/2019 at 4:51 am to provider Samara Snide , who verbally acknowledged these results. Review of the MIP images confirms the above findings IMPRESSION: 1. Left ICA occlusion at the bulb with supraclinoid reconstitution. There is a left posterior communicating artery and hypoplastic A1 segment. 2. Nonocclusive thrombus at the left M1 segment. Electronically Signed   By: Monte Fantasia M.D.   On: 08/24/2019 04:58   MR BRAIN WO CONTRAST  Result Date: 08/24/2019 CLINICAL DATA:  Follow-up stroke. Left ICA occlusion with supraclinoid reconstitution. Left M1  thrombus. EXAM: MRI HEAD WITHOUT CONTRAST TECHNIQUE: Multiplanar, multiecho pulse sequences of the brain and surrounding structures were obtained without intravenous contrast. COMPARISON:  CT studies done earlier today. FINDINGS: Brain: Diffusion imaging shows acute infarction affecting posterior aspect of the left putamen, midportion of the left caudate body, punctate involvement of the deep insular cortex and smudgy involvement of the left frontoparietal junction subcortical white matter. No large confluent infarction. Elsewhere, the brainstem and cerebellum are normal. No old infarction seen elsewhere. No mass, hemorrhage, hydrocephalus or extra-axial collection. Vascular: Major vessels at the base of the brain show flow. Skull and upper cervical spine: Negative Sinuses/Orbits: Clear/normal Other: None IMPRESSION: Acute infarction on the left affecting the posterior putamen, midportion of the caudate body, punctate involvement of the deep insular cortex and smudgy involvement of the frontoparietal junction subcortical white matter. No  mass effect or hemorrhage. Electronically Signed   By: Nelson Chimes M.D.   On: 08/24/2019 13:28   CT CEREBRAL PERFUSION W CONTRAST  Result Date: 08/24/2019 CLINICAL DATA:  Large vessel occlusion. Stroke scale has improved 0 currently EXAM: CT PERFUSION BRAIN TECHNIQUE: Multiphase CT imaging of the brain was performed following IV bolus contrast injection. Subsequent parametric perfusion maps were calculated using RAPID software. CONTRAST:  32mL OMNIPAQUE IOHEXOL 350 MG/ML SOLN COMPARISON:  CT and CTA from earlier today FINDINGS: CT Brain Perfusion Findings: CBF (<30%) Volume: 74mL Perfusion (Tmax>6.0s) volume: 3mL Mismatch Volume: 66mL ASPECTS on noncontrast CT Head: 10 at 4:35 a.m. today. Infarct Core: 0 mL IMPRESSION: 12 cc of penumbra in the left frontal parietal region. No evidence of core infarct. Electronically Signed   By: Monte Fantasia M.D.   On: 08/24/2019 05:04    ECHOCARDIOGRAM COMPLETE  Result Date: 08/25/2019    ECHOCARDIOGRAM REPORT   Patient Name:   Kevin Reynolds Date of Exam: 08/25/2019 Medical Rec #:  [id number]        Height:       68.0 in Accession #:    [id number]       Weight:       276.7 lb Date of Birth:  06-16-1950        BSA:          2.346 m Patient Age:    39 years         BP:           107/50 mmHg Patient Gender: M                HR:           67 bpm. Exam Location:  Inpatient Procedure: 2D Echo, Cardiac Doppler and Color Doppler Indications:    Stroke  History:        Patient has no prior history of Echocardiogram examinations.                 Risk Factors:Hypertension and Sleep Apnea.  Sonographer:    Roseanna Rainbow RDCS Referring Phys: [id number] [facility] R AROOR  Sonographer Comments: Technically difficult study due to poor echo windows and patient is morbidly obese. No prior cardiac history. IMPRESSIONS  1. Left ventricular ejection fraction, by estimation, is 60 to 65%. The left ventricle has normal function. The left ventricle has no regional wall motion abnormalities. There is moderate concentric left ventricular hypertrophy. Left ventricular diastolic parameters were normal.  2. Right ventricular systolic function is normal. The right ventricular size is normal. There is mildly elevated pulmonary artery systolic pressure.  3. The mitral valve is normal in structure. Trivial mitral valve regurgitation. No evidence of mitral stenosis.  4. The aortic valve is normal in structure. Aortic valve regurgitation is not visualized. No aortic stenosis is present.  5. The inferior vena cava is normal in size with <50% respiratory variability, suggesting right atrial pressure of 8 mmHg. FINDINGS  Left Ventricle: Left ventricular ejection fraction, by estimation, is 60 to 65%. The left ventricle has normal function. The left ventricle has no regional wall motion abnormalities. The left ventricular internal cavity size was normal in size. There is  moderate  concentric left ventricular hypertrophy. Left ventricular diastolic parameters were normal. Indeterminate filling pressures. Right Ventricle: The right ventricular size is normal. No increase in right ventricular wall thickness. Right ventricular systolic function is normal. There is mildly elevated pulmonary artery systolic pressure. The tricuspid regurgitant velocity is 2.80  m/s,  and with an assumed right atrial pressure of 8 mmHg, the estimated right ventricular systolic pressure is 16.1 mmHg. Left Atrium: Left atrial size was normal in size. Right Atrium: Right atrial size was normal in size. Pericardium: There is no evidence of pericardial effusion. Mitral Valve: The mitral valve is normal in structure. Normal mobility of the mitral valve leaflets. Trivial mitral valve regurgitation. No evidence of mitral valve stenosis. Tricuspid Valve: The tricuspid valve is normal in structure. Tricuspid valve regurgitation is trivial. No evidence of tricuspid stenosis. Aortic Valve: The aortic valve is normal in structure.. There is mild thickening and mild calcification of the aortic valve. Aortic valve regurgitation is not visualized. No aortic stenosis is present. There is mild thickening of the aortic valve. There is mild calcification of the aortic valve. Pulmonic Valve: The pulmonic valve was normal in structure. Pulmonic valve regurgitation is not visualized. No evidence of pulmonic stenosis. Aorta: The aortic root is normal in size and structure. Venous: The inferior vena cava is normal in size with less than 50% respiratory variability, suggesting right atrial pressure of 8 mmHg. IAS/Shunts: No atrial level shunt detected by color flow Doppler.  LEFT VENTRICLE PLAX 2D LVIDd:         4.80 cm  Diastology LVIDs:         3.10 cm  LV e' lateral:   14.40 cm/s LV PW:         1.64 cm  LV E/e' lateral: 7.2 LV IVS:        1.48 cm  LV e' medial:    8.27 cm/s LVOT diam:     2.05 cm  LV E/e' medial:  12.6 LV SV:         107 LV  SV Index:   45 LVOT Area:     3.30 cm  RIGHT VENTRICLE             IVC RV S prime:     14.00 cm/s  IVC diam: 1.90 cm TAPSE (M-mode): 2.2 cm LEFT ATRIUM             Index       RIGHT ATRIUM           Index LA diam:        3.00 cm 1.28 cm/m  RA Area:     17.30 cm LA Vol (A2C):   48.6 ml 20.72 ml/m RA Volume:   42.90 ml  18.29 ml/m LA Vol (A4C):   65.2 ml 27.79 ml/m LA Biplane Vol: 62.2 ml 26.51 ml/m  AORTIC VALVE LVOT Vmax:   158.00 cm/s LVOT Vmean:  105.000 cm/s LVOT VTI:    0.323 m  AORTA Ao Root diam: 3.80 cm MITRAL VALVE                TRICUSPID VALVE MV Area (PHT): 2.42 cm     TR Peak grad:   31.4 mmHg MV Decel Time: 313 msec     TR Vmax:        280.00 cm/s MV E velocity: 104.00 cm/s MV A velocity: 72.00 cm/s   SHUNTS MV E/A ratio:  1.44         Systemic VTI:  0.32 m                             Systemic Diam: 2.05 cm Skeet Latch MD Electronically signed by Skeet Latch MD Signature Date/Time: 08/25/2019/5:22:37 PM    Final  CT HEAD CODE STROKE WO CONTRAST  Result Date: 08/24/2019 CLINICAL DATA:  Code stroke. Ataxia with stroke suspected. Slurred speech EXAM: CT HEAD WITHOUT CONTRAST TECHNIQUE: Contiguous axial images were obtained from the base of the skull through the vertex without intravenous contrast. COMPARISON:  None. FINDINGS: Brain: No evidence of acute infarction, hemorrhage, hydrocephalus, extra-axial collection or mass lesion/mass effect. Vascular: Dense left proximal MCA Skull: Negative Sinuses/Orbits: Negative Other: These results were communicated to Dr. Lorraine Lax at 4:37 amon 6/9/2021by text page via the [facility] messaging system. CTA is already pending ASPECTS [facility] Stroke Program Early CT Score) - Ganglionic level infarction (caudate, lentiform nuclei, internal capsule, insula, M1-M3 cortex): 7 - Supraganglionic infarction (M4-M6 cortex): 3 Total score (0-10 with 10 being normal): 10 IMPRESSION: Dense left MCA with no visible infarct. No intracranial hemorrhage. ASPECTS is 10.  Electronically Signed   By: Monte Fantasia M.D.   On: 08/24/2019 04:38      HISTORY OF PRESENT ILLNESS Kevin Reynolds is a 69 y.o. male with past medical history significant for hypertension presents to the emergency department with slurred speech right-sided weakness on waking up at 3:30 in the morning. Last known normal is 11:30 PM on 08/23/19 when patient went to bed. On waking up, slurred speech, right facial droop right-sided weakness and EMS was called.  Blood pressure was 599 systolic.  Glucose was 117.  LVO score was 2-patient was brought to [name] ED. On initial assessment stroke scale is 4:  2 for facial droop, 1 for slurred speech, 1 yes right upper extremity drift. CT head showed no hemorrhage, was concerning for a hyperdense left MCA.  CT angiogram was performed-demonstrated left ICA occlusion at the bulb and the left M1 nonocclusive thrombus.  However, following CT head-patient symptoms completely resolved and NIHSS was 0 at 4.48 am.  To evaluate area of risk-CT perfusion was performed which showed no cord and ischemic penumbra of 12 cc on rapid software (likely underestimated value).    Patient was observed in the emergency room closely, around 5:15 AM symptoms began to recur.  Patient scored an NIH stroke scale of 5.  Now with mild aphasia.  Spoke with wife who was present at bedside about risk versus benefit, and after obtaining verbal consent and discussion with interventional neuroradiologist Dr. Patrecia Pour was taken to IR. NIHSS: 4>>0>>5. Baseline MRS 0.    HOSPITAL COURSE Kevin Reynolds is a 69 y.o. male with history of hypertension presenting with right-sided weakness, slurred speech and right facial droop.  No TPA given out of window.  CT with L ICA occlusion and left M1 nonocclusive thrombus.  Taken to IR.   Stroke:   L MCA infarct s/p L ICA and MCA revascularization w/ L ICA stent following angioplasty, infarct likely artery to artery embolus secondary to ICA stenosis   Code Stroke CT head dense left MCA.  ASPECTS 10.    CTA head & neck left ICA occlusion with supraclinoid reconstitution.  Left M1 M1 nonocclusive thrombus. CT perfusion left frontal parietal 12 cc penumbra.  No core infarct. Cerebral angiogram occluded L ICA and L MCA with TICI3 revascularization using penumbra and aspiration, s/p stent assisted angioplasty of occluded L ICA proximally with complete revascularization. Treated with cangrelor at time of angioplasty. Post IR CT no ICH or mass-effect MRI left posterior putamen, midportion caudate body, punctate deep insular cortex and frontoparietal junction subcortical white matter infarcts 2D Echo EF 60-65%. No source of embolus  LDL 65  HgbA1c 6.0 No antithrombotic  prior to admission, now on aspirin 81 mg daily and Brilinta (ticagrelor) 90 mg bid following aspirin load and cangrelor bolus and infusion s/p ICA stent.  (Co Pay $47) Therapy recommendations: no therapy needs Disposition:  return home   Hypertension Home meds: Norvasc 10 Treated with Cleviprex post IR BP now Stable  Long-term BP goal normotensive   Hyperlipidemia Home meds:  crestor 10 Added lipitor 40 during hospital stay as crestor was not initially listed LDL 56, goal < 70 Continue statin (crestor) at discharge   Dysphagia, resolved coretrak placed in PACU, now out On diet   Other Stroke Risk Factors Advanced age Former cigarette smoker ETOH use, alcohol level <10, advised to drink no more than 2 drink(s) a day Morbid Obesity, Body mass index is 42.07 kg/m., recommend weight loss, diet and exercise as appropriate    Other Active Problems Allergic rhinitis on Claritin and Nasonex prior to admission   DISCHARGE EXAM Blood pressure 135/71, pulse 77, temperature 98.6 F (37 C), temperature source Oral, resp. rate 18, height 5\' 8"  (1.727 m), weight 125.5 kg, SpO2 99 %. Pleasant middle-age obese African-American male not in distress. . Afebrile. Head is  nontraumatic. Neck is supple without bruit.    Cardiac exam no murmur or gallop. Lungs are clear to auscultation. Distal pulses are well felt. Neurological Exam : Awake alert oriented to time place and person.  Fluent speech without any paraphasic errors or hesitancy.  Good comprehension, naming repetition.  No dysarthria.  Extraocular movements are full range without nystagmus.  Blinks to threat bilaterally.  Face is symmetric tongue midline.  Motor system exam no upper or lower extremity drift.  Symmetric equal strength in all 4 extremities.  Diminished fine finger movements on the right.  Orbits left over right upper extremity.  Symmetric lower extremity strength.  Sensation appears intact bilaterally.  Coordination is slow but accurate.  Gait not tested.  Discharge Diet   Regular thin liquids  DISCHARGE PLAN Disposition:  Return home w/ wife No therapy needs aspirin 81 mg daily and Brilinta (ticagrelor) 90 mg bid for secondary stroke prevention  Ongoing stroke risk factor control by Primary Care Physician at time of discharge Follow-up PCP Foye Spurling, MD (Inactive) in 2 weeks. Follow-up in [facility] in 4 weeks, office to schedule an appointment.  Follow-up Sanjeev [facility]) Estanislado Pandy, MD (Interventional Neuroradiologist) in 2 weeks, office to schedule an appointment.   32 minutes were spent preparing discharge.  Burnetta Sabin, MSN, APRN, ANVP-BC, AGPCNP-BC Advanced Practice Stroke Nurse [place] for Schedule & Pager information 08/26/2019 2:49 PM   I have personally obtained history,examined this patient, reviewed notes, independently viewed imaging studies, participated in medical decision making and plan of care.ROS completed by me personally and pertinent positives fully documented  I have made any additions or clarifications directly to the above note. Agree with note above.    Antony Contras, MD Medical Director [facility]  Stroke Center Pager: [phone] 08/26/2019 4:21 PM

## 2019-08-26 NOTE — Evaluation (Addendum)
Speech Language Pathology Evaluation Patient Details Name: Kevin Reynolds MRN: [id number] DOB: [birth year] Today's Date: 08/26/2019 Time: 9485-4627 SLP Time Calculation (min) (ACUTE ONLY): 22 min  Problem List:  Patient Active Problem List   Diagnosis Date Noted  . Acute ischemic left MCA stroke ([place]) 08/24/2019  . Middle cerebral artery embolism, left 08/24/2019  . HTN (hypertension) 03/13/2014  . Seasonal allergies 03/13/2014  . Shoulder pain, left 03/13/2014   Past Medical History:  Past Medical History:  Diagnosis Date  . Arthritis    left shoulder  . Cataracts, bilateral   . Cough    related to allergies  . History of colonoscopy   . Hypertension    takes Amlodipine daily  . Joint pain    left  . Seasonal allergies    takes Claritin daily and Nasonex daily   Past Surgical History:  Past Surgical History:  Procedure Laterality Date  . CATARACT EXTRACTION W/PHACO Left 07/14/2012   Procedure: CATARACT EXTRACTION PHACO AND INTRAOCULAR LENS PLACEMENT (IOC);  Surgeon: Adonis Brook, MD;  Location: [facility];  Service: Ophthalmology;  Laterality: Left;  . COLONOSCOPY    . ESOPHAGOGASTRODUODENOSCOPY    . RADIOLOGY WITH ANESTHESIA N/A 08/24/2019   Procedure: IR WITH ANESTHESIA;  Surgeon: Luanne Bras, MD;  Location: [facility];  Service: Radiology;  Laterality: N/A;  . TONSILLECTOMY     HPI:  69 y.o. male with past medical history significant for hypertension presents to the emergency department with slurred speech right-sided weakness on waking up at 3:30 in the morning. NIHSS 4 on admission.  He underwent successful mechanical thrombectomy along with rescue left proximal carotid artery angioplasty and stenting.     Assessment / Plan / Recommendation Clinical Impression   Pt presents with mild higher level word finding deficits for more abstract, novel ideas and concepts.  Pt is able to find the word he wants to say but it takes him more time and effort than prior to admission.   Pt and his wife also endorse some mild higher level cognitive deficits which on assessment appear to be related to error awareness and ability to correct.  These were intermittent and mild.  Overall, pt and his wife report that pt is 90% back to baseline.  As a result, pt would benefit from ST follow up while admitted to maximize return to previous level of function but do not anticipate ST needs post discharge.    Of note, SLP received orders for swallow evaluation.  Pt has passed [facility] swallow screen and nursing denies any difficulty swallowing a regular diet and thin liquids As a result, SLP will defer swallow evaluation at this time.  Please feel free to re-consult for swallow evaluation should any difficulties tolerating POs arise.      SLP Assessment  SLP Recommendation/Assessment: Patient needs continued Speech Lanaguage Pathology Services SLP Visit Diagnosis: Cognitive communication deficit (R41.841)    Follow Up Recommendations  None    Frequency and Duration min 1 x/week         SLP Evaluation Cognition  Overall Cognitive Status: Impaired/Different from baseline Arousal/Alertness: Awake/alert Orientation Level: Oriented X4 Attention: Sustained Sustained Attention: Appears intact Memory: Appears intact Awareness: Appears intact Problem Solving: Appears intact Executive Function: Self Monitoring;Self Correcting Self Monitoring: Impaired Self Monitoring Impairment: Functional complex Self Correcting: Impaired Self Correcting Impairment: Functional complex       Comprehension  Auditory Comprehension Overall Auditory Comprehension: Appears within functional limits for tasks assessed    Expression Expression Primary Mode of Expression:  Verbal Verbal Expression Overall Verbal Expression: Impaired Initiation: No impairment Level of Generative/Spontaneous Verbalization: Conversation Naming: Impairment Other Naming Comments: higher level word finding deficits Pragmatics: No  impairment   Oral / Motor  Oral Motor/Sensory Function Overall Oral Motor/Sensory Function: Within functional limits Motor Speech Overall Motor Speech: Appears within functional limits for tasks assessed   GO                    Emilio Math 08/26/2019, 11:47 AM

## 2019-08-26 NOTE — TOC Transition Note (Signed)
Transition of Care [facility]) - CM/SW Discharge Note   Patient Details  Name: Kevin Reynolds MRN: [id number] Date of Birth: 12-24-1950  Transition of Care [facility]) CM/SW Contact:  Pollie Friar, RN Phone Number: 08/26/2019, 3:47 PM   Clinical Narrative:    Pt discharging home with self care. No f/u per PT/OT and no DME needs. Wife is able to provide recommended intermittent supervision. CM provided wife with 30 day free card for Brilinta. Pt feels he can afford the $47 co pay after the first 30 days. Pt and wife deny issues with home medications or with transportation. Wife providing transport home today.   Final next level of care: Home/Self Care Barriers to Discharge: No Barriers Identified   Patient Goals and CMS Choice        Discharge Placement                       Discharge Plan and Services                                     Social Determinants of Health (SDOH) Interventions     Readmission Risk Interventions No flowsheet data found.

## 2019-08-26 NOTE — Progress Notes (Signed)
Physical Therapy Treatment Patient Details Name: Kevin Reynolds MRN: [id number] DOB: 03/09/1951 Today's Date: 08/26/2019    History of Present Illness 69 y.o. male with past medical history significant for hypertension presents to the emergency department with slurred speech and right-sided weakness on waking up at 3:30 in the morning. NIHSS 4. s/p successful mechanical thrombectomy along with rescue left proximal carotid artery angioplasty and stenting.  Brain MRI-very tiny patchy left MCA branch infarcts without any hemorrhage.    PT Comments    Patient progressing well towards PT goals. Reports feeling in shock and in a sad mood with everything that has happened unexpectedly. Scored 19/24 on DGI indicating low fall risk. Tolerated transfers, ambulation and stairs with supervision for safety. Mild gait deviations noted with head turns (mild dizziness), but no overt LOB. HR stable in 70-90s bpm with activity. Discussed exercise recommendations once home as well as BeFA[employer]. Will continue to follow and progress as tolerated.    Follow Up Recommendations  No PT follow up     Equipment Recommendations  None recommended by PT    Recommendations for Other Services       Precautions / Restrictions Precautions Precautions: Fall Restrictions Weight Bearing Restrictions: No    Mobility  Bed Mobility               General bed mobility comments: Up in chair upon PT arrival.  Transfers Overall transfer level: Needs assistance Equipment used: None Transfers: Sit to/from Stand Sit to Stand: Supervision         General transfer comment: Supervision for safety. Stood from chair without difficulty. No LOB.  Ambulation/Gait Ambulation/Gait assistance: Supervision Gait Distance (Feet): 350 Feet Assistive device: None Gait Pattern/deviations: Step-through pattern;Decreased stride length;Drifts right/left Gait velocity: decreased   General Gait Details: Slow, mostly steady gait  with mild drifting noted with head turns and mild dizziness but no overt LOB. HR ranged from 70-90s bpm.   Stairs Stairs: Yes Stairs assistance: Supervision Stair Management: Alternating pattern;Step to pattern;Two rails Number of Stairs: 5 (x2 bouts) General stair comments: Able to perform stairs in alternating pattern to ascend and step to pattern to descend. Cues for technique/safety.   Wheelchair Mobility    Modified Rankin (Stroke Patients Only) Modified Rankin (Stroke Patients Only) Pre-Morbid Rankin Score: No symptoms Modified Rankin: Slight disability     Balance Overall balance assessment: Needs assistance Sitting-balance support: Feet supported;No upper extremity supported Sitting balance-Leahy Scale: Normal     Standing balance support: During functional activity Standing balance-Leahy Scale: Fair Standing balance comment: Able to stand at sink brushing teeth, washing face with supervision reaching outside boS without difficulty.             High level balance activites: Backward walking;Direction changes;Turns;Sudden stops;Head turns High Level Balance Comments: tolerated above with only mild deviations in gait, no over LOB. Standardized Balance Assessment Standardized Balance Assessment : Dynamic Gait Index   Dynamic Gait Index Level Surface: Mild Impairment Change in Gait Speed: Normal Gait with Horizontal Head Turns: Mild Impairment Gait with Vertical Head Turns: Mild Impairment Gait and Pivot Turn: Normal Step Over Obstacle: Mild Impairment Step Around Obstacles: Normal Steps: Mild Impairment Total Score: 19      Cognition Arousal/Alertness: Awake/alert Behavior During Therapy: Flat affect Overall Cognitive Status: Impaired/Different from baseline                                 General Comments: Reports  having a depressed mood. Also reports having no speech deficits he is aware of.      Exercises      General Comments         Pertinent Vitals/Pain Pain Assessment: 0-10 Pain Score: 3  Pain Location: abdomen Pain Descriptors / Indicators: Discomfort Pain Intervention(s): Monitored during session    Home Living     Available Help at Discharge: Available 24 hours/day Type of Home: House              Prior Function            PT Goals (current goals can now be found in the care plan section) Progress towards PT goals: Progressing toward goals    Frequency    Min 3X/week      PT Plan Current plan remains appropriate    Co-evaluation              AM-PAC PT "6 Clicks" Mobility   Outcome Measure  Help needed turning from your back to your side while in a flat bed without using bedrails?: None Help needed moving from lying on your back to sitting on the side of a flat bed without using bedrails?: None Help needed moving to and from a bed to a chair (including a wheelchair)?: None Help needed standing up from a chair using your arms (e.g., wheelchair or bedside chair)?: None Help needed to walk in hospital room?: None Help needed climbing 3-5 steps with a railing? : A Little 6 Click Score: 23    End of Session   Activity Tolerance: Patient tolerated treatment well Patient left: in chair;with call bell/phone within reach;with chair alarm set Nurse Communication: Mobility status PT Visit Diagnosis: Unsteadiness on feet (R26.81);Other symptoms and signs involving the nervous system (R29.898)     Time: 1035-1100 PT Time Calculation (min) (ACUTE ONLY): 25 min  Charges:  $Therapeutic Exercise: 8-22 mins $Neuromuscular Re-education: 8-22 mins                     Marisa Severin, PT, DPT Acute Rehabilitation Services Pager [phone] Office [place] 08/26/2019, 12:17 PM

## 2019-08-26 NOTE — Progress Notes (Signed)
Patient has been discharged from unit via wheelchair. Tele has been disconnected and all IVs were removed. AVS documentation was received and gone over by the RN. All belongings sent with patient as well. Patient denies pain and sent in good spirits.

## 2019-08-26 NOTE — Progress Notes (Signed)
NIR Brief Rounding Note:  History of acute CVA s/p cerebral arteriogram with emergent mechanical thrombectomy of left ICA and left MCA occlusions achieving a TICI 3 revascularization, along with revascularization of proximal left ICA acute occlusion using stent assisted angioplasty via right femoral approach 08/24/2019 by Dr. Estanislado Pandy.  Went to speak with patient bedside alongside Dr. Estanislado Pandy. Patient awake and alert sitting in chair eating breakfast. Complains of hiccups. Denies headache, weakness, numbness/tingling, dizziness, vision changes, hearing changes, tinnitus, or speech difficulty- however speech slow on exam.  Plan to follow-up with Dr. Estanislado Pandy for carotid US followed by consultation 2 weeks after discharge- order sent to facilitate this. Continue taking Brilinta 90 mg twice daily and Aspirin 81 mg once daily. Further plans per neurology- appreciate and agree with management. Please call NIR with questions/concerns.   Kevin Graff Dimitri Dsouza, PA-C 08/26/2019, 11:08 AM

## 2019-09-09 ENCOUNTER — Other Ambulatory Visit: Payer: Self-pay

## 2019-09-09 ENCOUNTER — Ambulatory Visit (HOSPITAL_COMMUNITY)
Admission: RE | Admit: 2019-09-09 | Discharge: 2019-09-09 | Disposition: A | Discharge status: Home / Self Care | Payer: Medicare Other | Source: Ambulatory Visit | Attending: Student | Admitting: Student

## 2019-09-09 DIAGNOSIS — I63232 Cerebral infarction due to unspecified occlusion or stenosis of left carotid arteries: Secondary | ICD-10-CM | POA: Diagnosis not present

## 2019-09-09 DIAGNOSIS — I10 Essential (primary) hypertension: Secondary | ICD-10-CM | POA: Diagnosis not present

## 2019-09-09 DIAGNOSIS — E7800 Pure hypercholesterolemia, unspecified: Secondary | ICD-10-CM | POA: Diagnosis not present

## 2019-09-09 NOTE — Progress Notes (Signed)
Chief Complaint: Patient was seen in consultation today for proximal left ICA acute occlusion s/p revascularization.  Referring Physician(s): Code Stroke- Aroor, Karena Addison R  Supervising Physician: Luanne Bras  Patient Status: [facility]  History of Present Illness: Teng Decou is a 69 y.o. male with a past medical history as below, with pertinent past medical history including hypertension, hyperlipidemia, CVA 08/2019, cataracts, arthritis, and morbid obesity. He is known to [facility] and has been followed by Dr. Estanislado Pandy since 08/2019. He first presented to our department as an active code stroke at the request of Dr. Lorraine Lax. He underwent an image-guided cerebral arteriogram with emergent mechanical thrombectomy of left ICA and left MCA occlusions achieving a TICI 3 revascularization, along with revascularization of proximal left ICA acute occlusion using stent assisted angioplasty via right femoral approach 08/24/2019 by Dr. Estanislado Pandy. He was discharged home 08/26/2019 in stable condition.  Patient presents today for follow-up regarding his recent procedure 08/24/2019. Patient awake and alert sitting in chair. Accompanied by wife. Complains of decreased sensation to hot/cold of right hand, residual from CVA earlier this month and improving at this time. Complains of an "ache" feeling of LLE that he experienced yesterday, described as "annoying" and "hard to explain". States he has issues with sciatic nerve of this leg. Complains of speech difficulty with a few words per wife. Per patient its "words I don't use often" (example = endocrinologist). Denies headache, weakness, numbness/tingling, dizziness, vision changes, hearing changes, or tinnitus.  Currently taking Brilinta 90 mg twice daily and Aspirin 81 mg once daily.   Past Medical History:  Diagnosis Date  . Arthritis    left shoulder  . Cataracts, bilateral   . Cough    related to allergies  . History of colonoscopy   .  Hypertension    takes Amlodipine daily  . Joint pain    left  . Seasonal allergies    takes Claritin daily and Nasonex daily    Past Surgical History:  Procedure Laterality Date  . CATARACT EXTRACTION W/PHACO Left 07/14/2012   Procedure: CATARACT EXTRACTION PHACO AND INTRAOCULAR LENS PLACEMENT (IOC);  Surgeon: Adonis Brook, MD;  Location: [place];  Service: Ophthalmology;  Laterality: Left;  . COLONOSCOPY    . ESOPHAGOGASTRODUODENOSCOPY    . IR ANGIO INTRA EXTRACRAN SEL COM CAROTID INNOMINATE UNI R MOD SED  08/24/2019  . IR CT HEAD LTD  08/24/2019  . IR INTRAVSC STENT CERV CAROTID W/O EMB-PROT MOD SED INC ANGIO  08/24/2019  . IR PERCUTANEOUS ART THROMBECTOMY/INFUSION INTRACRANIAL INC DIAG ANGIO  08/24/2019  . IR US GUIDE VASC ACCESS RIGHT  08/24/2019  . RADIOLOGY WITH ANESTHESIA N/A 08/24/2019   Procedure: IR WITH ANESTHESIA;  Surgeon: Luanne Bras, MD;  Location: [place];  Service: Radiology;  Laterality: N/A;  . TONSILLECTOMY      Allergies: Patient has no known allergies.  Medications: Prior to Admission medications   Medication Sig Start Date End Date Taking? Authorizing Provider  acetaminophen (TYLENOL) 500 MG tablet Take 500-1,000 mg by mouth every 4 (four) hours as needed for mild pain or headache (or dental pain).    [provider]  amLODipine (NORVASC) 10 MG tablet Take 10 mg by mouth daily.    [provider]  aspirin 81 MG chewable tablet Chew 1 tablet (81 mg total) by mouth daily. 08/27/19   Donzetta Starch, NP  augmented betamethasone dipropionate (DIPROLENE-AF) 0.05 % cream Apply 1 application topically 2 (two) times daily as needed (to itchy or irritated  sites).  06/21/19   [provider]  hydrALAZINE (APRESOLINE) 25 MG tablet Take 1 tablet (25 mg total) by mouth 3 (three) times daily. 08/26/19   Donzetta Starch, NP  loratadine (CLARITIN) 10 MG tablet Take 10 mg by mouth daily as needed for allergies.    [provider]  mometasone (NASONEX) 50  MCG/ACT nasal spray Place 2 sprays into the nose daily as needed (for allergies).     [provider]  rosuvastatin (CRESTOR) 10 MG tablet Take 10 mg by mouth at bedtime. 08/02/19   [provider]  ticagrelor (BRILINTA) 90 MG TABS tablet Take 1 tablet (90 mg total) by mouth 2 (two) times daily. 08/26/19   Donzetta Starch, NP  XIIDRA 5 % SOLN Place 1 drop into both eyes in the morning and at bedtime.  05/23/19   [provider]     Family History  Problem Relation Age of Onset  . Cancer Mother        Breast  . Cancer Brother        Liver    Social History   Socioeconomic History  . Marital status: Married    Spouse name: Not on file  . Number of children: Not on file  . Years of education: Not on file  . Highest education level: Not on file  Occupational History  . Not on file  Tobacco Use  . Smoking status: Former Research scientist (life sciences)  . Smokeless tobacco: Never Used  . Tobacco comment: quit at age 56  Substance and Sexual Activity  . Alcohol use: Yes    Comment: occasionally  . Drug use: No  . Sexual activity: Yes  Other Topics Concern  . Not on file  Social History Narrative  . Not on file   Social Determinants of Health   Financial Resource Strain:   . Difficulty of Paying Living Expenses:   Food Insecurity:   . Worried About Charity fundraiser in the Last Year:   . Arboriculturist in the Last Year:   Transportation Needs:   . Film/video editor (Medical):   Marland Kitchen Lack of Transportation (Non-Medical):   Physical Activity:   . Days of Exercise per Week:   . Minutes of Exercise per Session:   Stress:   . Feeling of Stress :   Social Connections:   . Frequency of Communication with Friends and Family:   . Frequency of Social Gatherings with Friends and Family:   . Attends Religious Services:   . Active Member of Clubs or Organizations:   . Attends Archivist Meetings:   Marland Kitchen Marital Status:      Review of Systems: A 12 point ROS  discussed and pertinent positives are indicated in the HPI above.  All other systems are negative.  Review of Systems  Constitutional: Negative for chills and fever.  HENT: Negative for hearing loss and tinnitus.   Eyes: Negative for visual disturbance.  Respiratory: Negative for shortness of breath and wheezing.   Cardiovascular: Negative for chest pain and palpitations.  Neurological: Positive for speech difficulty. Negative for dizziness, weakness, numbness and headaches.  Psychiatric/Behavioral: Negative for behavioral problems and confusion.    Vital Signs: There were no vitals taken for this visit.  Physical Exam Constitutional:      General: He is not in acute distress.    Appearance: Normal appearance.  Pulmonary:     Effort: Pulmonary effort is normal. No respiratory distress.  Skin:  General: Skin is warm and dry.  Neurological:     Mental Status: He is alert and oriented to person, place, and time.     Comments: Can spontaneously move all extremities. Hand grip strength grossly equal bilaterally. Plantarflexion/dorsiflexion strength grossly equal bilaterally. No pronator drift. Fine motor and coordination intact and symmetric. Point to point intact bilaterally.  Psychiatric:        Mood and Affect: Mood normal.        Behavior: Behavior normal.      Imaging: CT ANGIO HEAD W OR WO CONTRAST  Result Date: 08/24/2019 CLINICAL DATA:  Right arm weakness and slurred speech EXAM: CT ANGIOGRAPHY HEAD AND NECK TECHNIQUE: Multidetector CT imaging of the head and neck was performed using the standard protocol during bolus administration of intravenous contrast. Multiplanar CT image reconstructions and MIPs were obtained to evaluate the vascular anatomy. Carotid stenosis measurements (when applicable) are obtained utilizing NASCET criteria, using the distal internal carotid diameter as the denominator. CONTRAST:  23mL OMNIPAQUE IOHEXOL 350 MG/ML SOLN COMPARISON:  None. FINDINGS:  CTA NECK FINDINGS Aortic arch: 2 vessel branching.  No acute finding. Right carotid system: Moderate calcified plaque at the bulb without flow limiting stenosis or ulceration. Left carotid system: Calcified plaque at the bulb which is occluded. No flow seen in the left ICA. Vertebral arteries: No proximal subclavian stenosis. Slight left vertebral artery dominance. The vertebral arteries appear smooth and widely patent to the dura Skeleton: No acute or aggressive finding. Other neck: No acute finding Upper chest: Negative Review of the MIP images confirms the above findings CTA HEAD FINDINGS Anterior circulation: Intermittent flow in the left ICA at the skull base with final reconstitution at the supraclinoid segment. There is a hypoplastic left A1 segment. Present left posterior communicating artery. Along the anterior and inferior aspect of the M1 segment is a extrinsic filling defect appearance suggesting nonocclusive thrombus at this level. Allowing for quality of bolus density no downstream branch occlusion is seen. Posterior circulation: The vertebral and basilar arteries are smooth and widely patent. No branch occlusion or aneurysm. Venous sinuses: Negative Anatomic variants: Negative Critical Value/emergent results were called by telephone at the time of interpretation on 08/24/2019 at 4:51 am to provider Samara Snide , who verbally acknowledged these results. Review of the MIP images confirms the above findings IMPRESSION: 1. Left ICA occlusion at the bulb with supraclinoid reconstitution. There is a left posterior communicating artery and hypoplastic A1 segment. 2. Nonocclusive thrombus at the left M1 segment. Electronically Signed   By: Monte Fantasia M.D.   On: 08/24/2019 04:58   DG Chest 2 View  Result Date: 08/24/2019 CLINICAL DATA:  Acute CVA EXAM: CHEST - 2 VIEW COMPARISON:  July 01, 2012. FINDINGS: Feeding tube tip is below the diaphragm. Lungs are clear. Heart size and pulmonary vascularity  are normal. No adenopathy. No bone lesions. IMPRESSION: Lungs clear. Cardiac silhouette normal. Feeding tube tip below diaphragm. Electronically Signed   By: Lowella Grip III M.D.   On: 08/24/2019 13:46   CT ANGIO NECK W OR WO CONTRAST  Result Date: 08/24/2019 CLINICAL DATA:  Right arm weakness and slurred speech EXAM: CT ANGIOGRAPHY HEAD AND NECK TECHNIQUE: Multidetector CT imaging of the head and neck was performed using the standard protocol during bolus administration of intravenous contrast. Multiplanar CT image reconstructions and MIPs were obtained to evaluate the vascular anatomy. Carotid stenosis measurements (when applicable) are obtained utilizing NASCET criteria, using the distal internal carotid diameter as the denominator. CONTRAST:  23mL OMNIPAQUE IOHEXOL 350 MG/ML SOLN COMPARISON:  None. FINDINGS: CTA NECK FINDINGS Aortic arch: 2 vessel branching.  No acute finding. Right carotid system: Moderate calcified plaque at the bulb without flow limiting stenosis or ulceration. Left carotid system: Calcified plaque at the bulb which is occluded. No flow seen in the left ICA. Vertebral arteries: No proximal subclavian stenosis. Slight left vertebral artery dominance. The vertebral arteries appear smooth and widely patent to the dura Skeleton: No acute or aggressive finding. Other neck: No acute finding Upper chest: Negative Review of the MIP images confirms the above findings CTA HEAD FINDINGS Anterior circulation: Intermittent flow in the left ICA at the skull base with final reconstitution at the supraclinoid segment. There is a hypoplastic left A1 segment. Present left posterior communicating artery. Along the anterior and inferior aspect of the M1 segment is a extrinsic filling defect appearance suggesting nonocclusive thrombus at this level. Allowing for quality of bolus density no downstream branch occlusion is seen. Posterior circulation: The vertebral and basilar arteries are smooth and widely  patent. No branch occlusion or aneurysm. Venous sinuses: Negative Anatomic variants: Negative Critical Value/emergent results were called by telephone at the time of interpretation on 08/24/2019 at 4:51 am to provider Samara Snide , who verbally acknowledged these results. Review of the MIP images confirms the above findings IMPRESSION: 1. Left ICA occlusion at the bulb with supraclinoid reconstitution. There is a left posterior communicating artery and hypoplastic A1 segment. 2. Nonocclusive thrombus at the left M1 segment. Electronically Signed   By: Monte Fantasia M.D.   On: 08/24/2019 04:58   MR BRAIN WO CONTRAST  Result Date: 08/24/2019 CLINICAL DATA:  Follow-up stroke. Left ICA occlusion with supraclinoid reconstitution. Left M1 thrombus. EXAM: MRI HEAD WITHOUT CONTRAST TECHNIQUE: Multiplanar, multiecho pulse sequences of the brain and surrounding structures were obtained without intravenous contrast. COMPARISON:  CT studies done earlier today. FINDINGS: Brain: Diffusion imaging shows acute infarction affecting posterior aspect of the left putamen, midportion of the left caudate body, punctate involvement of the deep insular cortex and smudgy involvement of the left frontoparietal junction subcortical white matter. No large confluent infarction. Elsewhere, the brainstem and cerebellum are normal. No old infarction seen elsewhere. No mass, hemorrhage, hydrocephalus or extra-axial collection. Vascular: Major vessels at the base of the brain show flow. Skull and upper cervical spine: Negative Sinuses/Orbits: Clear/normal Other: None IMPRESSION: Acute infarction on the left affecting the posterior putamen, midportion of the caudate body, punctate involvement of the deep insular cortex and smudgy involvement of the frontoparietal junction subcortical white matter. No mass effect or hemorrhage. Electronically Signed   By: Nelson Chimes M.D.   On: 08/24/2019 13:28   IR INTRAVSC STENT CERV CAROTID W/O EMB-PROT  MOD SED  Result Date: 08/30/2019 INDICATION: New onset of right-sided weakness with expressive aphasia. 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 EXAM: 1. EMERGENT LARGE VESSEL OCCLUSION THROMBOLYSIS (anterior CIRCULATION) COMPARISON:  CT angiogram of the head and neck of August 24, 2019. MEDICATIONS: Ancef 2 g IV antibiotic was administered within 1 hour of the procedure. ANESTHESIA/SEDATION: General anesthesia CONTRAST:  Isovue 300 approximately 110 mL FLUOROSCOPY TIME:  Fluoroscopy Time: 56 minutes 42 seconds (3450 mGy). COMPLICATIONS: None immediate. TECHNIQUE: Following a full explanation of the procedure along with the potential associated complications, an informed witnessed consent was obtained. The risks of intracranial hemorrhage of 10%, worsening neurological deficit, ventilator dependency, death and  inability to revascularize were all reviewed in detail with the patient's spouse. The patient was then put under general anesthesia by the Department of Anesthesiology at [facility]. The right groin was prepped and draped in the usual sterile fashion. Using ultrasound guidance and using modified Seldinger technique, transfemoral access into the right common femoral artery was obtained without difficulty. Over a 0.035 inch guidewire an 8 French 25 cm Pinnacle sheath was inserted. Through this, and also over a 0.035 inch guidewire a 5 [place] JB 1 catheter was advanced to the aortic arch region and selectively positioned in the left common carotid artery and then the right common carotid artery. FINDINGS: The left common carotid arteriogram confirms occlusion of the left internal carotid artery at the bulb without evidence of a delayed string sign. The left external carotid artery demonstrates wide patency at its origin and of its branches. There was no distal reconstitution of  the left internal carotid artery from the distal left ACA branches in the ipsilateral ophthalmic artery. The right common carotid arteriogram performed following a complete revascularization demonstrates wide patency of the right external carotid artery and its branches. The right internal carotid artery at the bulb to the cranial skull base demonstrates wide patency. The petrous, the cavernous and the supraclinoid segments are widely patent. The right middle cerebral artery and the right anterior cerebral artery opacify into the capillary and venous phases. Cross-filling via the anterior communicating artery of the left anterior cerebral A1 distal and distal A2 segment is noted. PROCEDURE: The diagnostic Simmons 2 catheter in the left common carotid artery was then exchanged over a 0.035 inch 300 cm Rosen exchange guidewire for an 8 French 95 cm balloon guide catheter. The balloon had been prepped with 50% contrast and 50% heparinized saline infusion. The guidewire was removed. Good aspiration obtained from the hub of the balloon guide catheter. A gentle control arteriogram performed through this demonstrated no evidence of spasms, dissections or of intraluminal filling defects. Over a 0.014 inch standard Synchro micro guidewire with a J-tip configuration, an 021 microcatheter was then advanced without difficulty through the occluded left internal carotid artery at the bulb with some torque manipulation. The wire and microcatheter were advanced to the horizontal petrous segment of the left internal carotid artery. This was then exchanged for a 0.014 inch 300 cm Zoom exchange microcatheter. A control arteriogram performed through the balloon guide continued to demonstrate complete occlusion of the left internal carotid artery. A 4 mm x 30 mm Viatrac 14 angioplasty balloon guide catheter which had been prepped with 50% contrast and 50% heparinized saline infusion was then advanced using the rapid exchange technique  and positioned adequate distant at the site of the occluded bulb. Control angioplasty was then performed using micro inflation syringe device via micro tubing over approximately a minute. No evidence of bradycardia or of hemodynamic compromise was seen during this time. The balloon was deflated and retrieved and removed. A control arteriogram performed through the balloon guide catheter now demonstrated severe high-grade stenosis at the bulb and just distally. There was trickle flow noted in the more proximal left internal carotid artery. Over the exchange micro guidewire, an 021 microcatheter was reintroduced and advanced into the distal petrous segment. A control arteriogram performed in the proximal cavernous segment through the microcatheter demonstrated opacification with large multiple filling defects extending from cavernous and supraclinoid segments more proximally. No significant opacification was seen of the left middle cerebral artery. The microcatheter was then advanced over a  0.014 inch standard Synchro micro guidewire into the distal M2 M3 inferior division. The guidewire was removed. Good aspiration obtained from the hub of the microcatheter. A control arteriogram performed through the microcatheter demonstrated safe position of the tip of the microcatheter, which was connected to continuous heparinized saline infusion. A 5 mm x 37 mm Embotrap retrieval device was then advanced to the distal end of the microcatheter. The retrieval was then unsheathed in the usual manner and retrieved more proximally into the distal M1 segment. Thereafter aspiration was performed for a couple of minutes through the hub of the balloon guide which had now been advanced into left internal carotid artery just distal to the bulb and the balloon modestly inflated. The combination of the retrieval device and the microcatheter was retrieved and removed. Copious clot was noted in the Penumbra canister. Also noted was clot in  the hub of the Tuohy Borst. Balloon was deflated and retrieved more proximally. A control arteriogram performed through this in the left common carotid now demonstrated revascularization of the previously occluded left internal carotid artery to the cranial skull base and the left middle and the left anterior cerebral artery. A TICI 3 revascularization of the MCA target was achieved. The 021 microcatheter was then advanced again with a 014 inch standard Synchro micro guidewire and advanced to the petrous segment. The guidewire was removed. Good aspiration was obtained from the hub of the microcatheter. This was then exchanged for a 300 cm 014 inch Zoom exchange micro guidewire with a J configuration. Measurements were performed of the left internal carotid artery at the most normal proximal aspect, and of the left common carotid artery. A 6 mm x 8 mm x 40 mm Xact stent was then purged with heparinized saline infusion retrogradely its housing. Again using the rapid exchange technique, the stent delivery system was advanced to the distal end of the balloon guide catheter. This was then gently advanced and the distal and proximal markers were then verified. The stent was then deployed in the usual manner without any complications. The delivery mechanism was retrieved and removed. A control arteriogram performed through the 8 French balloon guide catheter in the left common carotid artery demonstrated excellent apposition distally and proximally with now significantly improved flow through the left internal carotid artery to the cranial skull base. The left MCA artery distribution continued to demonstrate a TICI 3 revascularization. There was now opacification of the left anterior cerebral A1 segment with flow into the more distal A2 segment of the left ACA distribution. Prior to placement of the stent, the patient was given a bolus dose of IV cangrelor followed by a slow IV infusion for about 4 hours. A 325 mg aspirin  was given in the patient's rectum at this time also. Control arteriograms were performed at approximately 25 minutes post placement of the stent which continued to demonstrate excellent flow without evidence of intra stent or distal intraluminal filling defects. The balloon guide was retrieved and removed. The 8 French Pinnacle sheath was also removed followed by placement of an 8 French Angio-Seal closure device with hemostasis. The right groin appeared soft without evidence of bleeding. Distal pulses remained Dopplerable in the dorsalis pedis, and the posterior tibial regions bilaterally. CT scan of brain obtained at the time of the cangrelor infusion demonstrated no evidence of hemorrhage or mass effect. Hemodynamically the patient's blood pressure and neurological status remained stable. Patient was then extubated without difficulty. Upon recovery the patient was able to maintain his  own respirations. However, he did not show movement of the right upper and right lower extremities. He was then transferred to the neuro ICU for post revascularization management. IMPRESSION: Status post endovascular complete revascularization of occluded left internal carotid artery and the left middle cerebral artery with 1 pass with a 5 mm x 37 mm Embotrap retrieval device and Penumbra aspiration achieving a TICI 3 revascularization. 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 PLAN: Follow-up in the clinic in 2 weeks post discharge with prior ultrasound of the carotids. To continue aspirin 81 mg a day, and Brilinta 90 mg twice a day for left internal carotid artery stent protection. Electronically Signed   By: Luanne Bras M.D.   On: 08/26/2019 08:26   IR CT Head Ltd  Result Date: 08/30/2019 INDICATION: New onset of right-sided weakness with expressive aphasia. CT angiogram revealing thrombus in the left middle cerebral  artery distal M1 segment, and complete angiographic occlusion of the left internal carotid artery proximally. EXAM: 1. EMERGENT LARGE VESSEL OCCLUSION THROMBOLYSIS (anterior CIRCULATION) COMPARISON:  CT angiogram of the head and neck of August 24, 2019. MEDICATIONS: Ancef 2 g IV antibiotic was administered within 1 hour of the procedure. ANESTHESIA/SEDATION: General anesthesia CONTRAST:  Isovue 300 approximately 110 mL FLUOROSCOPY TIME:  Fluoroscopy Time: 56 minutes 42 seconds (3450 mGy). COMPLICATIONS: None immediate. TECHNIQUE: Following a full explanation of the procedure along with the potential associated complications, an informed witnessed consent was obtained. The risks of intracranial hemorrhage of 10%, worsening neurological deficit, ventilator dependency, death and inability to revascularize were all reviewed in detail with the patient's spouse. The patient was then put under general anesthesia by the Department of Anesthesiology at [facility]. The right groin was prepped and draped in the usual sterile fashion. Using ultrasound guidance and using modified Seldinger technique, transfemoral access into the right common femoral artery was obtained without difficulty. Over a 0.035 inch guidewire an 8 French 25 cm Pinnacle sheath was inserted. Through this, and also over a 0.035 inch guidewire a 5 [place] JB 1 catheter was advanced to the aortic arch region and selectively positioned in the left common carotid artery and then the right common carotid artery. FINDINGS: The left common carotid arteriogram confirms occlusion of the left internal carotid artery at the bulb without evidence of a delayed string sign. The left external carotid artery demonstrates wide patency at its origin and of its branches. There was no distal reconstitution of the left internal carotid artery from the distal left ACA branches in the ipsilateral ophthalmic artery. The right common carotid arteriogram performed following a  complete revascularization demonstrates wide patency of the right external carotid artery and its branches. The right internal carotid artery at the bulb to the cranial skull base demonstrates wide patency. The petrous, the cavernous and the supraclinoid segments are widely patent. The right middle cerebral artery and the right anterior cerebral artery opacify into the capillary and venous phases. Cross-filling via the anterior communicating artery of the left anterior cerebral A1 distal and distal A2 segment is noted. PROCEDURE: The diagnostic Simmons 2 catheter in the left common carotid artery was then exchanged over a 0.035 inch 300 cm Rosen exchange guidewire for an 8 French 95 cm balloon guide catheter. The balloon had been prepped with 50% contrast and 50% heparinized saline infusion. The guidewire was removed. Good aspiration obtained from the hub of the balloon guide catheter. A gentle control arteriogram  performed through this demonstrated no evidence of spasms, dissections or of intraluminal filling defects. Over a 0.014 inch standard Synchro micro guidewire with a J-tip configuration, an 021 microcatheter was then advanced without difficulty through the occluded left internal carotid artery at the bulb with some torque manipulation. The wire and microcatheter were advanced to the horizontal petrous segment of the left internal carotid artery. This was then exchanged for a 0.014 inch 300 cm Zoom exchange microcatheter. A control arteriogram performed through the balloon guide continued to demonstrate complete occlusion of the left internal carotid artery. A 4 mm x 30 mm Viatrac 14 angioplasty balloon guide catheter which had been prepped with 50% contrast and 50% heparinized saline infusion was then advanced using the rapid exchange technique and positioned adequate distant at the site of the occluded bulb. Control angioplasty was then performed using micro inflation syringe device via micro tubing over  approximately a minute. No evidence of bradycardia or of hemodynamic compromise was seen during this time. The balloon was deflated and retrieved and removed. A control arteriogram performed through the balloon guide catheter now demonstrated severe high-grade stenosis at the bulb and just distally. There was trickle flow noted in the more proximal left internal carotid artery. Over the exchange micro guidewire, an 021 microcatheter was reintroduced and advanced into the distal petrous segment. A control arteriogram performed in the proximal cavernous segment through the microcatheter demonstrated opacification with large multiple filling defects extending from cavernous and supraclinoid segments more proximally. No significant opacification was seen of the left middle cerebral artery. The microcatheter was then advanced over a 0.014 inch standard Synchro micro guidewire into the distal M2 M3 inferior division. The guidewire was removed. Good aspiration obtained from the hub of the microcatheter. A control arteriogram performed through the microcatheter demonstrated safe position of the tip of the microcatheter, which was connected to continuous heparinized saline infusion. A 5 mm x 37 mm Embotrap retrieval device was then advanced to the distal end of the microcatheter. The retrieval was then unsheathed in the usual manner and retrieved more proximally into the distal M1 segment. Thereafter aspiration was performed for a couple of minutes through the hub of the balloon guide which had now been advanced into left internal carotid artery just distal to the bulb and the balloon modestly inflated. The combination of the retrieval device and the microcatheter was retrieved and removed. Copious clot was noted in the Penumbra canister. Also noted was clot in the hub of the Tuohy Borst. Balloon was deflated and retrieved more proximally. A control arteriogram performed through this in the left common carotid now  demonstrated revascularization of the previously occluded left internal carotid artery to the cranial skull base and the left middle and the left anterior cerebral artery. A TICI 3 revascularization of the MCA target was achieved. The 021 microcatheter was then advanced again with a 014 inch standard Synchro micro guidewire and advanced to the petrous segment. The guidewire was removed. Good aspiration was obtained from the hub of the microcatheter. This was then exchanged for a 300 cm 014 inch Zoom exchange micro guidewire with a J configuration. Measurements were performed of the left internal carotid artery at the most normal proximal aspect, and of the left common carotid artery. A 6 mm x 8 mm x 40 mm Xact stent was then purged with heparinized saline infusion retrogradely its housing. Again using the rapid exchange technique, the stent delivery system was advanced to the distal end of the balloon guide  catheter. This was then gently advanced and the distal and proximal markers were then verified. The stent was then deployed in the usual manner without any complications. The delivery mechanism was retrieved and removed. A control arteriogram performed through the 8 French balloon guide catheter in the left common carotid artery demonstrated excellent apposition distally and proximally with now significantly improved flow through the left internal carotid artery to the cranial skull base. The left MCA artery distribution continued to demonstrate a TICI 3 revascularization. There was now opacification of the left anterior cerebral A1 segment with flow into the more distal A2 segment of the left ACA distribution. Prior to placement of the stent, the patient was given a bolus dose of IV cangrelor followed by a slow IV infusion for about 4 hours. A 325 mg aspirin was given in the patient's rectum at this time also. Control arteriograms were performed at approximately 25 minutes post placement of the stent which  continued to demonstrate excellent flow without evidence of intra stent or distal intraluminal filling defects. The balloon guide was retrieved and removed. The 8 French Pinnacle sheath was also removed followed by placement of an 8 French Angio-Seal closure device with hemostasis. The right groin appeared soft without evidence of bleeding. Distal pulses remained Dopplerable in the dorsalis pedis, and the posterior tibial regions bilaterally. CT scan of brain obtained at the time of the cangrelor infusion demonstrated no evidence of hemorrhage or mass effect. Hemodynamically the patient's blood pressure and neurological status remained stable. Patient was then extubated without difficulty. Upon recovery the patient was able to maintain his own respirations. However, he did not show movement of the right upper and right lower extremities. He was then transferred to the neuro ICU for post revascularization management. IMPRESSION: Status post endovascular complete revascularization of occluded left internal carotid artery and the left middle cerebral artery with 1 pass with a 5 mm x 37 mm Embotrap retrieval device and Penumbra aspiration achieving a TICI 3 revascularization. 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 PLAN: Follow-up in the clinic in 2 weeks post discharge with prior ultrasound of the carotids. To continue aspirin 81 mg a day, and Brilinta 90 mg twice a day for left internal carotid artery stent protection. Electronically Signed   By: Luanne Bras M.D.   On: 08/26/2019 08:26   IR US Guide Vasc Access Right  Result Date: 08/30/2019 INDICATION: New onset of right-sided weakness with expressive aphasia. 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 EXAM: 1. EMERGENT LARGE VESSEL  OCCLUSION THROMBOLYSIS (anterior CIRCULATION) COMPARISON:  CT angiogram of the head and neck of August 24, 2019. MEDICATIONS: Ancef 2 g IV antibiotic was administered within 1 hour of the procedure. ANESTHESIA/SEDATION: General anesthesia CONTRAST:  Isovue 300 approximately 110 mL FLUOROSCOPY TIME:  Fluoroscopy Time: 56 minutes 42 seconds (3450 mGy). COMPLICATIONS: None immediate. TECHNIQUE: Following a full explanation of the procedure along with the potential associated complications, an informed witnessed consent was obtained. The risks of intracranial hemorrhage of 10%, worsening neurological deficit, ventilator dependency, death and inability to revascularize were all reviewed in detail with the patient's spouse. The patient was then put under general anesthesia by the Department of Anesthesiology at [facility]. The right groin was prepped and draped in the usual sterile fashion. Using ultrasound guidance and using modified Seldinger technique, transfemoral  access into the right common femoral artery was obtained without difficulty. Over a 0.035 inch guidewire an 8 French 25 cm Pinnacle sheath was inserted. Through this, and also over a 0.035 inch guidewire a 5 [place] JB 1 catheter was advanced to the aortic arch region and selectively positioned in the left common carotid artery and then the right common carotid artery. FINDINGS: The left common carotid arteriogram confirms occlusion of the left internal carotid artery at the bulb without evidence of a delayed string sign. The left external carotid artery demonstrates wide patency at its origin and of its branches. There was no distal reconstitution of the left internal carotid artery from the distal left ACA branches in the ipsilateral ophthalmic artery. The right common carotid arteriogram performed following a complete revascularization demonstrates wide patency of the right external carotid artery and its branches. The right internal carotid artery  at the bulb to the cranial skull base demonstrates wide patency. The petrous, the cavernous and the supraclinoid segments are widely patent. The right middle cerebral artery and the right anterior cerebral artery opacify into the capillary and venous phases. Cross-filling via the anterior communicating artery of the left anterior cerebral A1 distal and distal A2 segment is noted. PROCEDURE: The diagnostic Simmons 2 catheter in the left common carotid artery was then exchanged over a 0.035 inch 300 cm Rosen exchange guidewire for an 8 French 95 cm balloon guide catheter. The balloon had been prepped with 50% contrast and 50% heparinized saline infusion. The guidewire was removed. Good aspiration obtained from the hub of the balloon guide catheter. A gentle control arteriogram performed through this demonstrated no evidence of spasms, dissections or of intraluminal filling defects. Over a 0.014 inch standard Synchro micro guidewire with a J-tip configuration, an 021 microcatheter was then advanced without difficulty through the occluded left internal carotid artery at the bulb with some torque manipulation. The wire and microcatheter were advanced to the horizontal petrous segment of the left internal carotid artery. This was then exchanged for a 0.014 inch 300 cm Zoom exchange microcatheter. A control arteriogram performed through the balloon guide continued to demonstrate complete occlusion of the left internal carotid artery. A 4 mm x 30 mm Viatrac 14 angioplasty balloon guide catheter which had been prepped with 50% contrast and 50% heparinized saline infusion was then advanced using the rapid exchange technique and positioned adequate distant at the site of the occluded bulb. Control angioplasty was then performed using micro inflation syringe device via micro tubing over approximately a minute. No evidence of bradycardia or of hemodynamic compromise was seen during this time. The balloon was deflated and  retrieved and removed. A control arteriogram performed through the balloon guide catheter now demonstrated severe high-grade stenosis at the bulb and just distally. There was trickle flow noted in the more proximal left internal carotid artery. Over the exchange micro guidewire, an 021 microcatheter was reintroduced and advanced into the distal petrous segment. A control arteriogram performed in the proximal cavernous segment through the microcatheter demonstrated opacification with large multiple filling defects extending from cavernous and supraclinoid segments more proximally. No significant opacification was seen of the left middle cerebral artery. The microcatheter was then advanced over a 0.014 inch standard Synchro micro guidewire into the distal M2 M3 inferior division. The guidewire was removed. Good aspiration obtained from the hub of the microcatheter. A control arteriogram performed through the microcatheter demonstrated safe position of the tip of the microcatheter, which was connected to continuous heparinized saline infusion.  A 5 mm x 37 mm Embotrap retrieval device was then advanced to the distal end of the microcatheter. The retrieval was then unsheathed in the usual manner and retrieved more proximally into the distal M1 segment. Thereafter aspiration was performed for a couple of minutes through the hub of the balloon guide which had now been advanced into left internal carotid artery just distal to the bulb and the balloon modestly inflated. The combination of the retrieval device and the microcatheter was retrieved and removed. Copious clot was noted in the Penumbra canister. Also noted was clot in the hub of the Tuohy Borst. Balloon was deflated and retrieved more proximally. A control arteriogram performed through this in the left common carotid now demonstrated revascularization of the previously occluded left internal carotid artery to the cranial skull base and the left middle and the left  anterior cerebral artery. A TICI 3 revascularization of the MCA target was achieved. The 021 microcatheter was then advanced again with a 014 inch standard Synchro micro guidewire and advanced to the petrous segment. The guidewire was removed. Good aspiration was obtained from the hub of the microcatheter. This was then exchanged for a 300 cm 014 inch Zoom exchange micro guidewire with a J configuration. Measurements were performed of the left internal carotid artery at the most normal proximal aspect, and of the left common carotid artery. A 6 mm x 8 mm x 40 mm Xact stent was then purged with heparinized saline infusion retrogradely its housing. Again using the rapid exchange technique, the stent delivery system was advanced to the distal end of the balloon guide catheter. This was then gently advanced and the distal and proximal markers were then verified. The stent was then deployed in the usual manner without any complications. The delivery mechanism was retrieved and removed. A control arteriogram performed through the 8 French balloon guide catheter in the left common carotid artery demonstrated excellent apposition distally and proximally with now significantly improved flow through the left internal carotid artery to the cranial skull base. The left MCA artery distribution continued to demonstrate a TICI 3 revascularization. There was now opacification of the left anterior cerebral A1 segment with flow into the more distal A2 segment of the left ACA distribution. Prior to placement of the stent, the patient was given a bolus dose of IV cangrelor followed by a slow IV infusion for about 4 hours. A 325 mg aspirin was given in the patient's rectum at this time also. Control arteriograms were performed at approximately 25 minutes post placement of the stent which continued to demonstrate excellent flow without evidence of intra stent or distal intraluminal filling defects. The balloon guide was retrieved and  removed. The 8 French Pinnacle sheath was also removed followed by placement of an 8 French Angio-Seal closure device with hemostasis. The right groin appeared soft without evidence of bleeding. Distal pulses remained Dopplerable in the dorsalis pedis, and the posterior tibial regions bilaterally. CT scan of brain obtained at the time of the cangrelor infusion demonstrated no evidence of hemorrhage or mass effect. Hemodynamically the patient's blood pressure and neurological status remained stable. Patient was then extubated without difficulty. Upon recovery the patient was able to maintain his own respirations. However, he did not show movement of the right upper and right lower extremities. He was then transferred to the neuro ICU for post revascularization management. IMPRESSION: Status post endovascular complete revascularization of occluded left internal carotid artery and the left middle cerebral artery with 1 pass with  a 5 mm x 37 mm Embotrap retrieval device and Penumbra aspiration achieving a TICI 3 revascularization. 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 PLAN: Follow-up in the clinic in 2 weeks post discharge with prior ultrasound of the carotids. To continue aspirin 81 mg a day, and Brilinta 90 mg twice a day for left internal carotid artery stent protection. Electronically Signed   By: Luanne Bras M.D.   On: 08/26/2019 08:26   CT CEREBRAL PERFUSION W CONTRAST  Result Date: 08/24/2019 CLINICAL DATA:  Large vessel occlusion. Stroke scale has improved 0 currently EXAM: CT PERFUSION BRAIN TECHNIQUE: Multiphase CT imaging of the brain was performed following IV bolus contrast injection. Subsequent parametric perfusion maps were calculated using RAPID software. CONTRAST:  7mL OMNIPAQUE IOHEXOL 350 MG/ML SOLN COMPARISON:  CT and CTA from earlier today FINDINGS: CT Brain Perfusion Findings: CBF (<30%)  Volume: 33mL Perfusion (Tmax>6.0s) volume: 1mL Mismatch Volume: 69mL ASPECTS on noncontrast CT Head: 10 at 4:35 a.m. today. Infarct Core: 0 mL IMPRESSION: 12 cc of penumbra in the left frontal parietal region. No evidence of core infarct. Electronically Signed   By: Monte Fantasia M.D.   On: 08/24/2019 05:04   ECHOCARDIOGRAM COMPLETE  Result Date: 08/25/2019    ECHOCARDIOGRAM REPORT   Patient Name:   Kevin Reynolds Date of Exam: 08/25/2019 Medical Rec #:  [id number]        Height:       68.0 in Accession #:    [id number]       Weight:       276.7 lb Date of Birth:  09-29-1950        BSA:          2.346 m Patient Age:    14 years         BP:           107/50 mmHg Patient Gender: M                HR:           67 bpm. Exam Location:  Inpatient Procedure: 2D Echo, Cardiac Doppler and Color Doppler Indications:    Stroke  History:        Patient has no prior history of Echocardiogram examinations.                 Risk Factors:Hypertension and Sleep Apnea.  Sonographer:    Roseanna Rainbow RDCS Referring Phys: [id number] [facility] R AROOR  Sonographer Comments: Technically difficult study due to poor echo windows and patient is morbidly obese. No prior cardiac history. IMPRESSIONS  1. Left ventricular ejection fraction, by estimation, is 60 to 65%. The left ventricle has normal function. The left ventricle has no regional wall motion abnormalities. There is moderate concentric left ventricular hypertrophy. Left ventricular diastolic parameters were normal.  2. Right ventricular systolic function is normal. The right ventricular size is normal. There is mildly elevated pulmonary artery systolic pressure.  3. The mitral valve is normal in structure. Trivial mitral valve regurgitation. No evidence of mitral stenosis.  4. The aortic valve is normal in structure. Aortic valve regurgitation is not visualized. No aortic stenosis is present.  5. The inferior vena cava is normal in size with <50% respiratory variability, suggesting  right atrial pressure of 8 mmHg. FINDINGS  Left Ventricle: Left ventricular ejection fraction, by estimation, is 60 to 65%. The left ventricle has normal function. The left ventricle has no  regional wall motion abnormalities. The left ventricular internal cavity size was normal in size. There is  moderate concentric left ventricular hypertrophy. Left ventricular diastolic parameters were normal. Indeterminate filling pressures. Right Ventricle: The right ventricular size is normal. No increase in right ventricular wall thickness. Right ventricular systolic function is normal. There is mildly elevated pulmonary artery systolic pressure. The tricuspid regurgitant velocity is 2.80  m/s, and with an assumed right atrial pressure of 8 mmHg, the estimated right ventricular systolic pressure is 59.4 mmHg. Left Atrium: Left atrial size was normal in size. Right Atrium: Right atrial size was normal in size. Pericardium: There is no evidence of pericardial effusion. Mitral Valve: The mitral valve is normal in structure. Normal mobility of the mitral valve leaflets. Trivial mitral valve regurgitation. No evidence of mitral valve stenosis. Tricuspid Valve: The tricuspid valve is normal in structure. Tricuspid valve regurgitation is trivial. No evidence of tricuspid stenosis. Aortic Valve: The aortic valve is normal in structure.. There is mild thickening and mild calcification of the aortic valve. Aortic valve regurgitation is not visualized. No aortic stenosis is present. There is mild thickening of the aortic valve. There is mild calcification of the aortic valve. Pulmonic Valve: The pulmonic valve was normal in structure. Pulmonic valve regurgitation is not visualized. No evidence of pulmonic stenosis. Aorta: The aortic root is normal in size and structure. Venous: The inferior vena cava is normal in size with less than 50% respiratory variability, suggesting right atrial pressure of 8 mmHg. IAS/Shunts: No atrial level shunt  detected by color flow Doppler.  LEFT VENTRICLE PLAX 2D LVIDd:         4.80 cm  Diastology LVIDs:         3.10 cm  LV e' lateral:   14.40 cm/s LV PW:         1.64 cm  LV E/e' lateral: 7.2 LV IVS:        1.48 cm  LV e' medial:    8.27 cm/s LVOT diam:     2.05 cm  LV E/e' medial:  12.6 LV SV:         107 LV SV Index:   45 LVOT Area:     3.30 cm  RIGHT VENTRICLE             IVC RV S prime:     14.00 cm/s  IVC diam: 1.90 cm TAPSE (M-mode): 2.2 cm LEFT ATRIUM             Index       RIGHT ATRIUM           Index LA diam:        3.00 cm 1.28 cm/m  RA Area:     17.30 cm LA Vol (A2C):   48.6 ml 20.72 ml/m RA Volume:   42.90 ml  18.29 ml/m LA Vol (A4C):   65.2 ml 27.79 ml/m LA Biplane Vol: 62.2 ml 26.51 ml/m  AORTIC VALVE LVOT Vmax:   158.00 cm/s LVOT Vmean:  105.000 cm/s LVOT VTI:    0.323 m  AORTA Ao Root diam: 3.80 cm MITRAL VALVE                TRICUSPID VALVE MV Area (PHT): 2.42 cm     TR Peak grad:   31.4 mmHg MV Decel Time: 313 msec     TR Vmax:        280.00 cm/s MV E velocity: 104.00 cm/s MV A velocity: 72.00 cm/s  SHUNTS MV E/A ratio:  1.44         Systemic VTI:  0.32 m                             Systemic Diam: 2.05 cm Skeet Latch MD Electronically signed by Skeet Latch MD Signature Date/Time: 08/25/2019/5:22:37 PM    Final    IR PERCUTANEOUS ART THROMBECTOMY/INFUSION INTRACRANIAL INC DIAG ANGIO  Result Date: 08/30/2019 INDICATION: New onset of right-sided weakness with expressive aphasia. 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 EXAM: 1. EMERGENT LARGE VESSEL OCCLUSION THROMBOLYSIS (anterior CIRCULATION) COMPARISON:  CT angiogram of the head and neck of August 24, 2019. MEDICATIONS: Ancef 2 g IV antibiotic was administered within 1 hour of the procedure. ANESTHESIA/SEDATION: General anesthesia CONTRAST:  Isovue 300 approximately 110 mL FLUOROSCOPY TIME:  Fluoroscopy  Time: 56 minutes 42 seconds (3450 mGy). COMPLICATIONS: None immediate. TECHNIQUE: Following a full explanation of the procedure along with the potential associated complications, an informed witnessed consent was obtained. The risks of intracranial hemorrhage of 10%, worsening neurological deficit, ventilator dependency, death and inability to revascularize were all reviewed in detail with the patient's spouse. The patient was then put under general anesthesia by the Department of Anesthesiology at [facility]. The right groin was prepped and draped in the usual sterile fashion. Using ultrasound guidance and using modified Seldinger technique, transfemoral access into the right common femoral artery was obtained without difficulty. Over a 0.035 inch guidewire an 8 French 25 cm Pinnacle sheath was inserted. Through this, and also over a 0.035 inch guidewire a 5 [place] JB 1 catheter was advanced to the aortic arch region and selectively positioned in the left common carotid artery and then the right common carotid artery. FINDINGS: The left common carotid arteriogram confirms occlusion of the left internal carotid artery at the bulb without evidence of a delayed string sign. The left external carotid artery demonstrates wide patency at its origin and of its branches. There was no distal reconstitution of the left internal carotid artery from the distal left ACA branches in the ipsilateral ophthalmic artery. The right common carotid arteriogram performed following a complete revascularization demonstrates wide patency of the right external carotid artery and its branches. The right internal carotid artery at the bulb to the cranial skull base demonstrates wide patency. The petrous, the cavernous and the supraclinoid segments are widely patent. The right middle cerebral artery and the right anterior cerebral artery opacify into the capillary and venous phases. Cross-filling via the anterior communicating artery  of the left anterior cerebral A1 distal and distal A2 segment is noted. PROCEDURE: The diagnostic Simmons 2 catheter in the left common carotid artery was then exchanged over a 0.035 inch 300 cm Rosen exchange guidewire for an 8 French 95 cm balloon guide catheter. The balloon had been prepped with 50% contrast and 50% heparinized saline infusion. The guidewire was removed. Good aspiration obtained from the hub of the balloon guide catheter. A gentle control arteriogram performed through this demonstrated no evidence of spasms, dissections or of intraluminal filling defects. Over a 0.014 inch standard Synchro micro guidewire with a J-tip configuration, an 021 microcatheter was then advanced without difficulty through the occluded left internal carotid artery at the bulb with some torque manipulation. The wire and microcatheter were advanced to the horizontal petrous segment of the left internal  carotid artery. This was then exchanged for a 0.014 inch 300 cm Zoom exchange microcatheter. A control arteriogram performed through the balloon guide continued to demonstrate complete occlusion of the left internal carotid artery. A 4 mm x 30 mm Viatrac 14 angioplasty balloon guide catheter which had been prepped with 50% contrast and 50% heparinized saline infusion was then advanced using the rapid exchange technique and positioned adequate distant at the site of the occluded bulb. Control angioplasty was then performed using micro inflation syringe device via micro tubing over approximately a minute. No evidence of bradycardia or of hemodynamic compromise was seen during this time. The balloon was deflated and retrieved and removed. A control arteriogram performed through the balloon guide catheter now demonstrated severe high-grade stenosis at the bulb and just distally. There was trickle flow noted in the more proximal left internal carotid artery. Over the exchange micro guidewire, an 021 microcatheter was reintroduced  and advanced into the distal petrous segment. A control arteriogram performed in the proximal cavernous segment through the microcatheter demonstrated opacification with large multiple filling defects extending from cavernous and supraclinoid segments more proximally. No significant opacification was seen of the left middle cerebral artery. The microcatheter was then advanced over a 0.014 inch standard Synchro micro guidewire into the distal M2 M3 inferior division. The guidewire was removed. Good aspiration obtained from the hub of the microcatheter. A control arteriogram performed through the microcatheter demonstrated safe position of the tip of the microcatheter, which was connected to continuous heparinized saline infusion. A 5 mm x 37 mm Embotrap retrieval device was then advanced to the distal end of the microcatheter. The retrieval was then unsheathed in the usual manner and retrieved more proximally into the distal M1 segment. Thereafter aspiration was performed for a couple of minutes through the hub of the balloon guide which had now been advanced into left internal carotid artery just distal to the bulb and the balloon modestly inflated. The combination of the retrieval device and the microcatheter was retrieved and removed. Copious clot was noted in the Penumbra canister. Also noted was clot in the hub of the Tuohy Borst. Balloon was deflated and retrieved more proximally. A control arteriogram performed through this in the left common carotid now demonstrated revascularization of the previously occluded left internal carotid artery to the cranial skull base and the left middle and the left anterior cerebral artery. A TICI 3 revascularization of the MCA target was achieved. The 021 microcatheter was then advanced again with a 014 inch standard Synchro micro guidewire and advanced to the petrous segment. The guidewire was removed. Good aspiration was obtained from the hub of the microcatheter. This was  then exchanged for a 300 cm 014 inch Zoom exchange micro guidewire with a J configuration. Measurements were performed of the left internal carotid artery at the most normal proximal aspect, and of the left common carotid artery. A 6 mm x 8 mm x 40 mm Xact stent was then purged with heparinized saline infusion retrogradely its housing. Again using the rapid exchange technique, the stent delivery system was advanced to the distal end of the balloon guide catheter. This was then gently advanced and the distal and proximal markers were then verified. The stent was then deployed in the usual manner without any complications. The delivery mechanism was retrieved and removed. A control arteriogram performed through the 8 French balloon guide catheter in the left common carotid artery demonstrated excellent apposition distally and proximally with now significantly improved flow through  the left internal carotid artery to the cranial skull base. The left MCA artery distribution continued to demonstrate a TICI 3 revascularization. There was now opacification of the left anterior cerebral A1 segment with flow into the more distal A2 segment of the left ACA distribution. Prior to placement of the stent, the patient was given a bolus dose of IV cangrelor followed by a slow IV infusion for about 4 hours. A 325 mg aspirin was given in the patient's rectum at this time also. Control arteriograms were performed at approximately 25 minutes post placement of the stent which continued to demonstrate excellent flow without evidence of intra stent or distal intraluminal filling defects. The balloon guide was retrieved and removed. The 8 French Pinnacle sheath was also removed followed by placement of an 8 French Angio-Seal closure device with hemostasis. The right groin appeared soft without evidence of bleeding. Distal pulses remained Dopplerable in the dorsalis pedis, and the posterior tibial regions bilaterally. CT scan of brain  obtained at the time of the cangrelor infusion demonstrated no evidence of hemorrhage or mass effect. Hemodynamically the patient's blood pressure and neurological status remained stable. Patient was then extubated without difficulty. Upon recovery the patient was able to maintain his own respirations. However, he did not show movement of the right upper and right lower extremities. He was then transferred to the neuro ICU for post revascularization management. IMPRESSION: Status post endovascular complete revascularization of occluded left internal carotid artery and the left middle cerebral artery with 1 pass with a 5 mm x 37 mm Embotrap retrieval device and Penumbra aspiration achieving a TICI 3 revascularization. 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 PLAN: Follow-up in the clinic in 2 weeks post discharge with prior ultrasound of the carotids. To continue aspirin 81 mg a day, and Brilinta 90 mg twice a day for left internal carotid artery stent protection. Electronically Signed   By: Luanne Bras M.D.   On: 08/26/2019 08:26   CT HEAD CODE STROKE WO CONTRAST  Result Date: 08/24/2019 CLINICAL DATA:  Code stroke. Ataxia with stroke suspected. Slurred speech EXAM: CT HEAD WITHOUT CONTRAST TECHNIQUE: Contiguous axial images were obtained from the base of the skull through the vertex without intravenous contrast. COMPARISON:  None. FINDINGS: Brain: No evidence of acute infarction, hemorrhage, hydrocephalus, extra-axial collection or mass lesion/mass effect. Vascular: Dense left proximal MCA Skull: Negative Sinuses/Orbits: Negative Other: These results were communicated to Dr. Lorraine Lax at 4:37 amon 6/9/2021by text page via the [facility] messaging system. CTA is already pending ASPECTS [facility] Stroke Program Early CT Score) - Ganglionic level infarction (caudate, lentiform nuclei, internal capsule, insula, M1-M3  cortex): 7 - Supraganglionic infarction (M4-M6 cortex): 3 Total score (0-10 with 10 being normal): 10 IMPRESSION: Dense left MCA with no visible infarct. No intracranial hemorrhage. ASPECTS is 10. Electronically Signed   By: Monte Fantasia M.D.   On: 08/24/2019 04:38   IR ANGIO INTRA EXTRACRAN SEL COM CAROTID INNOMINATE UNI R MOD SED  Result Date: 08/30/2019 INDICATION: New onset of right-sided weakness with expressive aphasia. 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 EXAM: 1. EMERGENT LARGE VESSEL OCCLUSION THROMBOLYSIS (anterior CIRCULATION) COMPARISON:  CT angiogram of the head and neck of August 24, 2019. MEDICATIONS: Ancef 2 g IV antibiotic was administered within 1 hour of the procedure. ANESTHESIA/SEDATION: General anesthesia CONTRAST:  Isovue 300  approximately 110 mL FLUOROSCOPY TIME:  Fluoroscopy Time: 56 minutes 42 seconds (3450 mGy). COMPLICATIONS: None immediate. TECHNIQUE: Following a full explanation of the procedure along with the potential associated complications, an informed witnessed consent was obtained. The risks of intracranial hemorrhage of 10%, worsening neurological deficit, ventilator dependency, death and inability to revascularize were all reviewed in detail with the patient's spouse. The patient was then put under general anesthesia by the Department of Anesthesiology at [facility]. The right groin was prepped and draped in the usual sterile fashion. Using ultrasound guidance and using modified Seldinger technique, transfemoral access into the right common femoral artery was obtained without difficulty. Over a 0.035 inch guidewire an 8 French 25 cm Pinnacle sheath was inserted. Through this, and also over a 0.035 inch guidewire a 5 [place] JB 1 catheter was advanced to the aortic arch region and selectively positioned in the left common carotid artery  and then the right common carotid artery. FINDINGS: The left common carotid arteriogram confirms occlusion of the left internal carotid artery at the bulb without evidence of a delayed string sign. The left external carotid artery demonstrates wide patency at its origin and of its branches. There was no distal reconstitution of the left internal carotid artery from the distal left ACA branches in the ipsilateral ophthalmic artery. The right common carotid arteriogram performed following a complete revascularization demonstrates wide patency of the right external carotid artery and its branches. The right internal carotid artery at the bulb to the cranial skull base demonstrates wide patency. The petrous, the cavernous and the supraclinoid segments are widely patent. The right middle cerebral artery and the right anterior cerebral artery opacify into the capillary and venous phases. Cross-filling via the anterior communicating artery of the left anterior cerebral A1 distal and distal A2 segment is noted. PROCEDURE: The diagnostic Simmons 2 catheter in the left common carotid artery was then exchanged over a 0.035 inch 300 cm Rosen exchange guidewire for an 8 French 95 cm balloon guide catheter. The balloon had been prepped with 50% contrast and 50% heparinized saline infusion. The guidewire was removed. Good aspiration obtained from the hub of the balloon guide catheter. A gentle control arteriogram performed through this demonstrated no evidence of spasms, dissections or of intraluminal filling defects. Over a 0.014 inch standard Synchro micro guidewire with a J-tip configuration, an 021 microcatheter was then advanced without difficulty through the occluded left internal carotid artery at the bulb with some torque manipulation. The wire and microcatheter were advanced to the horizontal petrous segment of the left internal carotid artery. This was then exchanged for a 0.014 inch 300 cm Zoom exchange microcatheter. A  control arteriogram performed through the balloon guide continued to demonstrate complete occlusion of the left internal carotid artery. A 4 mm x 30 mm Viatrac 14 angioplasty balloon guide catheter which had been prepped with 50% contrast and 50% heparinized saline infusion was then advanced using the rapid exchange technique and positioned adequate distant at the site of the occluded bulb. Control angioplasty was then performed using micro inflation syringe device via micro tubing over approximately a minute. No evidence of bradycardia or of hemodynamic compromise was seen during this time. The balloon was deflated and retrieved and removed. A control arteriogram performed through the balloon guide catheter now demonstrated severe high-grade stenosis at the bulb and just distally. There was trickle flow noted in the more proximal left internal carotid artery. Over the exchange micro guidewire, an 021 microcatheter was reintroduced and  advanced into the distal petrous segment. A control arteriogram performed in the proximal cavernous segment through the microcatheter demonstrated opacification with large multiple filling defects extending from cavernous and supraclinoid segments more proximally. No significant opacification was seen of the left middle cerebral artery. The microcatheter was then advanced over a 0.014 inch standard Synchro micro guidewire into the distal M2 M3 inferior division. The guidewire was removed. Good aspiration obtained from the hub of the microcatheter. A control arteriogram performed through the microcatheter demonstrated safe position of the tip of the microcatheter, which was connected to continuous heparinized saline infusion. A 5 mm x 37 mm Embotrap retrieval device was then advanced to the distal end of the microcatheter. The retrieval was then unsheathed in the usual manner and retrieved more proximally into the distal M1 segment. Thereafter aspiration was performed for a couple of  minutes through the hub of the balloon guide which had now been advanced into left internal carotid artery just distal to the bulb and the balloon modestly inflated. The combination of the retrieval device and the microcatheter was retrieved and removed. Copious clot was noted in the Penumbra canister. Also noted was clot in the hub of the Tuohy Borst. Balloon was deflated and retrieved more proximally. A control arteriogram performed through this in the left common carotid now demonstrated revascularization of the previously occluded left internal carotid artery to the cranial skull base and the left middle and the left anterior cerebral artery. A TICI 3 revascularization of the MCA target was achieved. The 021 microcatheter was then advanced again with a 014 inch standard Synchro micro guidewire and advanced to the petrous segment. The guidewire was removed. Good aspiration was obtained from the hub of the microcatheter. This was then exchanged for a 300 cm 014 inch Zoom exchange micro guidewire with a J configuration. Measurements were performed of the left internal carotid artery at the most normal proximal aspect, and of the left common carotid artery. A 6 mm x 8 mm x 40 mm Xact stent was then purged with heparinized saline infusion retrogradely its housing. Again using the rapid exchange technique, the stent delivery system was advanced to the distal end of the balloon guide catheter. This was then gently advanced and the distal and proximal markers were then verified. The stent was then deployed in the usual manner without any complications. The delivery mechanism was retrieved and removed. A control arteriogram performed through the 8 French balloon guide catheter in the left common carotid artery demonstrated excellent apposition distally and proximally with now significantly improved flow through the left internal carotid artery to the cranial skull base. The left MCA artery distribution continued to  demonstrate a TICI 3 revascularization. There was now opacification of the left anterior cerebral A1 segment with flow into the more distal A2 segment of the left ACA distribution. Prior to placement of the stent, the patient was given a bolus dose of IV cangrelor followed by a slow IV infusion for about 4 hours. A 325 mg aspirin was given in the patient's rectum at this time also. Control arteriograms were performed at approximately 25 minutes post placement of the stent which continued to demonstrate excellent flow without evidence of intra stent or distal intraluminal filling defects. The balloon guide was retrieved and removed. The 8 French Pinnacle sheath was also removed followed by placement of an 8 French Angio-Seal closure device with hemostasis. The right groin appeared soft without evidence of bleeding. Distal pulses remained Dopplerable in the dorsalis pedis,  and the posterior tibial regions bilaterally. CT scan of brain obtained at the time of the cangrelor infusion demonstrated no evidence of hemorrhage or mass effect. Hemodynamically the patient's blood pressure and neurological status remained stable. Patient was then extubated without difficulty. Upon recovery the patient was able to maintain his own respirations. However, he did not show movement of the right upper and right lower extremities. He was then transferred to the neuro ICU for post revascularization management. IMPRESSION: Status post endovascular complete revascularization of occluded left internal carotid artery and the left middle cerebral artery with 1 pass with a 5 mm x 37 mm Embotrap retrieval device and Penumbra aspiration achieving a TICI 3 revascularization. 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 PLAN: Follow-up in the clinic in 2 weeks post discharge with prior ultrasound of the carotids. To continue aspirin 81 mg a day, and  Brilinta 90 mg twice a day for left internal carotid artery stent protection. Electronically Signed   By: Luanne Bras M.D.   On: 08/26/2019 08:26    Labs:  CBC: Recent Labs    08/24/19 0547 08/24/19 0556 08/25/19 0440  WBC 5.9  --  9.4  HGB 16.0 16.7 12.6*  HCT 48.9 49.0 37.8*  PLT 193  --  165    COAGS: Recent Labs    08/24/19 0435 08/24/19 0547  INR 1.2 1.0  APTT 20* 24    BMP: Recent Labs    08/24/19 0435 08/24/19 0556 08/25/19 0440  NA 138 140 140  K 3.7 4.2 3.7  CL 108 103 111  CO2 21*  --  21*  GLUCOSE 137* 129* 123*  BUN 18 25* 12  CALCIUM 9.0  --  8.2*  CREATININE 1.27* 1.20 1.18  GFRNONAA 58*  --  >60  GFRAA >60  --  >60    LIVER FUNCTION TESTS: Recent Labs    08/24/19 0435  BILITOT 0.7  AST 30  ALT 25  ALKPHOS 56  PROT 6.9  ALBUMIN 3.9     Assessment and Plan:  History of acute CVA s/p cerebral arteriogram with emergent mechanical thrombectomy of left ICA and left MCA occlusions achieving a TICI 3 revascularization, along with revascularization of proximal left ICA acute occlusion using stent assisted angioplasty via right femoral approach 08/24/2019 by Dr. Estanislado Pandy. Dr. Estanislado Pandy was present for consultation.  Discussed patient's post-stroke course. Patient states that he has been doing well except for right hand decreased sensation. Discussed secondary stroke prevention. Recommend patient follow-up with his PCP and neurologist regularly. Counseled patient on weight loss management. Encouraged patient to exercise as tolerated (walk daily).  Discussed left ICA stent. Explained the best course of management for his stent at this time is with routine imaging scans to monitor for changes. Plan for follow-up with carotid US 4 months from recent procedure 08/24/2019. Informed patient that our schedulers will call him to set up this imaging scan. Instructed patient to continue taking Brilinta 90 mg twice daily and Aspirin 81 mg once  daily.  All questions answered and concerns addressed. Patient and wife convey understanding and agree with plan.  Thank you for this interesting consult.  I greatly enjoyed meeting Volney Reierson and look forward to participating in their care.  A copy of this report was sent to the requesting provider on this date.  Electronically Signed: Earley Abide, PA-C 09/09/2019, 10:41 AM   I spent a total of 40 Minutes in face  to face in clinical consultation, greater than 50% of which was counseling/coordinating care for proximal left ICA acute occlusion s/p revascularization.

## 2019-09-12 ENCOUNTER — Emergency Department (HOSPITAL_COMMUNITY): Payer: Medicare Other

## 2019-09-12 ENCOUNTER — Other Ambulatory Visit: Payer: Self-pay

## 2019-09-12 ENCOUNTER — Emergency Department (HOSPITAL_COMMUNITY)
Admission: EM | Admit: 2019-09-12 | Discharge: 2019-09-12 | Disposition: A | Discharge status: Home / Self Care | Payer: Medicare Other | Attending: Emergency Medicine | Admitting: Emergency Medicine

## 2019-09-12 ENCOUNTER — Encounter (HOSPITAL_COMMUNITY): Payer: Self-pay | Admitting: Emergency Medicine

## 2019-09-12 DIAGNOSIS — I63412 Cerebral infarction due to embolism of left middle cerebral artery: Secondary | ICD-10-CM | POA: Diagnosis not present

## 2019-09-12 DIAGNOSIS — R252 Cramp and spasm: Secondary | ICD-10-CM | POA: Diagnosis not present

## 2019-09-12 DIAGNOSIS — Z79899 Other long term (current) drug therapy: Secondary | ICD-10-CM | POA: Insufficient documentation

## 2019-09-12 DIAGNOSIS — K5900 Constipation, unspecified: Secondary | ICD-10-CM | POA: Diagnosis not present

## 2019-09-12 DIAGNOSIS — I10 Essential (primary) hypertension: Secondary | ICD-10-CM | POA: Diagnosis not present

## 2019-09-12 LAB — COMPREHENSIVE METABOLIC PANEL
ALT: 68 U/L — ABNORMAL HIGH (ref 0–44)
AST: 52 U/L — ABNORMAL HIGH (ref 15–41)
Albumin: 4.2 g/dL (ref 3.5–5.0)
Alkaline Phosphatase: 57 U/L (ref 38–126)
Anion gap: 10 (ref 5–15)
BUN: 14 mg/dL (ref 8–23)
CO2: 22 mmol/L (ref 22–32)
Calcium: 9.6 mg/dL (ref 8.9–10.3)
Chloride: 106 mmol/L (ref 98–111)
Creatinine, Ser: 1.24 mg/dL (ref 0.61–1.24)
GFR calc Af Amer: >60 mL/min (ref >60)
GFR calc non Af Amer: >60 mL/min (ref >60)
Glucose, Bld: 124 mg/dL — ABNORMAL HIGH (ref 70–99)
Potassium: 3.7 mmol/L (ref 3.5–5.1)
Sodium: 138 mmol/L (ref 135–145)
Total Bilirubin: 0.5 mg/dL (ref 0.3–1.2)
Total Protein: 7.7 g/dL (ref 6.5–8.1)

## 2019-09-12 LAB — CBC
HCT: 42.3 % (ref 39.0–52.0)
Hemoglobin: 14 g/dL (ref 13.0–17.0)
MCH: 29.8 pg (ref 26.0–34.0)
MCHC: 33.1 g/dL (ref 30.0–36.0)
MCV: 90 fL (ref 80.0–100.0)
Platelets: 214 10*3/uL (ref 150–400)
RBC: 4.7 MIL/uL (ref 4.22–5.81)
RDW: 13.2 % (ref 11.5–15.5)
WBC: 5.8 10*3/uL (ref 4.0–10.5)
nRBC: 0 % (ref 0.0–0.2)

## 2019-09-12 LAB — URINALYSIS, ROUTINE W REFLEX MICROSCOPIC
Bacteria, UA: NONE SEEN
Bilirubin Urine: NEGATIVE
Glucose, UA: NEGATIVE mg/dL
Ketones, ur: NEGATIVE mg/dL
Leukocytes,Ua: NEGATIVE
Nitrite: NEGATIVE
Protein, ur: NEGATIVE mg/dL
Specific Gravity, Urine: 1.011 (ref 1.005–1.030)
pH: 6 (ref 5.0–8.0)

## 2019-09-12 LAB — LIPASE, BLOOD: Lipase: 36 U/L (ref 11–51)

## 2019-09-12 MED ORDER — SODIUM CHLORIDE 0.9% FLUSH: 3.0000 mL | Freq: Once | INTRAVENOUS | Status: DC

## 2019-09-12 NOTE — ED Provider Notes (Signed)
[facility] EMERGENCY DEPARTMENT Provider Note   CSN: [id number] Arrival date & time: 09/12/19  3382     History Chief Complaint  Patient presents with   abdominal spasms    Jeremy Ditullio is a 69 y.o. male.  HPI History of hypertension, hyperlipidemia, morbid obesity.  Patient was admitted earlier this month for a MCA infarct s/p L ICA and MCA revascularization w/ L ICA stent following angioplasty, infarct likely artery to artery embolus secondary to ICA stenosis.  Patient was ultimately discharged on June 11 with neurological follow-up.  His neurological exam was benign at the time of discharge.  Patient states he has been exercising every morning.  He states he ambulated for about 15 minutes this morning.  He then began experiencing what he describes as "abdominal spasms" in the central part of his abdomen as well as aches/chills in his bilateral lower extremities.  Due to his recent CVA, patient was concerned and decided to come to the emergency department for evaluation.  He is having no pain at this time.  He does note that since his discharge from the hospital he has not been having any regular bowel movements.  He had a small/hard BM 2 days ago but otherwise states that he has been extremely constipated.  He has no other complaints at this time. He denies fevers, chills, URI symptoms, chest pain, shortness of breath, nausea, vomiting, diarrhea, urinary changes, syncope.     Past Medical History:  Diagnosis Date   Arthritis    left shoulder   Cataracts, bilateral    Cough    related to allergies   History of colonoscopy    Hypertension    takes Amlodipine daily   Joint pain    left   Seasonal allergies    takes Claritin daily and Nasonex daily    Patient Active Problem List   Diagnosis Date Noted   Hyperlipidemia 08/26/2019   Morbid obesity ([place]) 08/26/2019   Acute ischemic left MCA stroke ([place]) s/p IR w/ L ICA stent placement  08/24/2019   Middle cerebral artery embolism, left 08/24/2019   HTN (hypertension) 03/13/2014   Seasonal allergies 03/13/2014   Shoulder pain, left 03/13/2014    Past Surgical History:  Procedure Laterality Date   CATARACT EXTRACTION W/PHACO Left 07/14/2012   Procedure: CATARACT EXTRACTION PHACO AND INTRAOCULAR LENS PLACEMENT ([place]);  Surgeon: Adonis Brook, MD;  Location: [place];  Service: Ophthalmology;  Laterality: Left;   COLONOSCOPY     ESOPHAGOGASTRODUODENOSCOPY     IR ANGIO INTRA EXTRACRAN SEL COM CAROTID INNOMINATE UNI R MOD SED  08/24/2019   IR CT HEAD LTD  08/24/2019   IR INTRAVSC STENT CERV CAROTID W/O EMB-PROT MOD SED INC ANGIO  08/24/2019   IR PERCUTANEOUS ART THROMBECTOMY/INFUSION INTRACRANIAL INC DIAG ANGIO  08/24/2019   IR US GUIDE VASC ACCESS RIGHT  08/24/2019   RADIOLOGY WITH ANESTHESIA N/A 08/24/2019   Procedure: IR WITH ANESTHESIA;  Surgeon: Luanne Bras, MD;  Location: [place];  Service: Radiology;  Laterality: N/A;   TONSILLECTOMY         Family History  Problem Relation Age of Onset   Cancer Mother        Breast   Cancer Brother        Liver    Social History   Tobacco Use   Smoking status: Former Smoker   Smokeless tobacco: Never Used   Tobacco comment: quit at age 54  Substance Use Topics   Alcohol use: Yes  Comment: occasionally   Drug use: No    Home Medications Prior to Admission medications   Medication Sig Start Date End Date Taking? Authorizing Provider  acetaminophen (TYLENOL) 500 MG tablet Take 500-1,000 mg by mouth every 4 (four) hours as needed for mild pain or headache (or dental pain).    [provider]  amLODipine (NORVASC) 10 MG tablet Take 10 mg by mouth daily.    [provider]  aspirin 81 MG chewable tablet Chew 1 tablet (81 mg total) by mouth daily. 08/27/19   Donzetta Starch, NP  augmented betamethasone dipropionate (DIPROLENE-AF) 0.05 % cream Apply 1 application topically 2 (two) times daily  as needed (to itchy or irritated sites).  06/21/19   [provider]  hydrALAZINE (APRESOLINE) 25 MG tablet Take 1 tablet (25 mg total) by mouth 3 (three) times daily. 08/26/19   Donzetta Starch, NP  loratadine (CLARITIN) 10 MG tablet Take 10 mg by mouth daily as needed for allergies.    [provider]  mometasone (NASONEX) 50 MCG/ACT nasal spray Place 2 sprays into the nose daily as needed (for allergies).     [provider]  rosuvastatin (CRESTOR) 10 MG tablet Take 10 mg by mouth at bedtime. 08/02/19   [provider]  ticagrelor (BRILINTA) 90 MG TABS tablet Take 1 tablet (90 mg total) by mouth 2 (two) times daily. 08/26/19   Donzetta Starch, NP  XIIDRA 5 % SOLN Place 1 drop into both eyes in the morning and at bedtime.  05/23/19   [provider]    Allergies    Patient has no known allergies.  Review of Systems   Review of Systems  All other systems reviewed and are negative. Ten systems reviewed and are negative for acute change, except as noted in the HPI.    Physical Exam Updated Vital Signs BP 140/76 (BP Location: Right Arm)    Pulse 71    Temp 98.2 F (36.8 C) (Oral)    Resp 17    Ht 5' 8.75" (1.746 m)    Wt 122.9 kg    SpO2 99%    BMI 40.31 kg/m   Physical Exam Vitals and nursing note reviewed.  Constitutional:      General: He is not in acute distress.    Appearance: Normal appearance. He is not ill-appearing, toxic-appearing or diaphoretic.  HENT:     Head: Normocephalic and atraumatic.     Right Ear: External ear normal.     Left Ear: External ear normal.     Nose: Nose normal.     Mouth/Throat:     Mouth: Mucous membranes are moist.     Pharynx: Oropharynx is clear. No oropharyngeal exudate or posterior oropharyngeal erythema.  Eyes:     Extraocular Movements: Extraocular movements intact.  Cardiovascular:     Rate and Rhythm: Normal rate and regular rhythm.     Pulses: Normal pulses.     Heart sounds: Normal heart sounds.  No murmur heard.  No friction rub. No gallop.   Pulmonary:     Effort: Pulmonary effort is normal. No respiratory distress.     Breath sounds: Normal breath sounds. No stridor. No wheezing, rhonchi or rales.  Abdominal:     General: Abdomen is flat.     Palpations: Abdomen is soft.     Tenderness: There is no abdominal tenderness.     Comments: Protuberant abdomen that is soft and nontender with deep palpation in all 4  abdominal quadrants.  Hypoactive bowel sounds.  Musculoskeletal:        General: Normal range of motion.     Cervical back: Normal range of motion and neck supple. No tenderness.  Skin:    General: Skin is warm and dry.  Neurological:     General: No focal deficit present.     Mental Status: He is alert and oriented to person, place, and time.     Comments: Distal sensation intact in all 4 extremities to light touch.  Patient is able to move all 4 extremities without difficulty.  Strength is 5 out of 5 in all 4 extremities.  Negative pronator drift.  Finger-to-nose intact bilaterally.  Extraocular movements intact.  Patient is phonating clearly, and coherently, in complete sentences.  No slurring.  No facial droop.  Psychiatric:        Mood and Affect: Mood normal.        Behavior: Behavior normal.    ED Results / Procedures / Treatments   Labs (all labs ordered are listed, but only abnormal results are displayed) Labs Reviewed  COMPREHENSIVE METABOLIC PANEL - Abnormal; Notable for the following components:      Result Value   Glucose, Bld 124 (*)    AST 52 (*)    ALT 68 (*)    All other components within normal limits  URINALYSIS, ROUTINE W REFLEX MICROSCOPIC - Abnormal; Notable for the following components:   Color, Urine STRAW (*)    Hgb urine dipstick SMALL (*)    All other components within normal limits  LIPASE, BLOOD  CBC   EKG None  Radiology DG Abd Portable 1 View  Result Date: 09/12/2019 CLINICAL DATA:  Constipation. EXAM: PORTABLE ABDOMEN - 1  VIEW COMPARISON:  None. FINDINGS: Moderate stool in the right colon and transverse colon. Small amount of stool in the rectum. No distended small bowel loops to suggest obstruction. The soft tissue shadows of the abdomen are maintained. The bony structures are intact. IMPRESSION: Moderate stool in the right colon and transverse colon. Electronically Signed   By: Marijo Sanes M.D.   On: 09/12/2019 07:21    Procedures Procedures   Medications Ordered in ED Medications  sodium chloride flush (NS) 0.9 % injection 3 mL (3 mLs Intravenous Not Given 09/12/19 0353)    ED Course  I have reviewed the triage vital signs and the nursing notes.  Pertinent labs & imaging results that were available during my care of the patient were reviewed by me and considered in my medical decision making (see chart for details).    MDM Rules/Calculators/A&P                          Patient is a 69 year old male with history and physical exam as noted above.  Initial labs were obtained in triage.  CBC and lipase are benign.  Small amount of hemoglobin noted in the urine.  Hyperglycemia of 124.  There is a slight increase in his AST and ALT at 52 and 68 respectively.    I additionally obtained a abdominal x-ray to rule out SBO.  There is no sign of obstruction.  There is moderate stool noted in the right colon and transverse colon.  Discuss starting the patient on MiraLAX.  Recommended continued exercise and hydration as tolerated.  He was given strict return precautions and understands signs and symptoms of SBO when to return to the emergency department for reevaluation.  Recommend  PCP follow-up if he does not notice any improvement in 1 week after starting MiraLAX.  His questions were answered and he was amicable to time of discharge.  His vital signs are stable.  Patient discharged to home/self care.  Condition at discharge: Stable  Note: Portions of this report may have been transcribed using voice  recognition software. Every effort was made to ensure accuracy; however, inadvertent computerized transcription errors may be present.    Final Clinical Impression(s) / ED Diagnoses Final diagnoses:  Muscle cramping  Constipation, unspecified constipation type   Rx / DC Orders ED Discharge Orders    None       Rayna Sexton, PA-C 09/12/19 [place], [place], DO 09/12/19 [phone]

## 2019-09-12 NOTE — ED Notes (Signed)
Pt transported to Xray via stretcher

## 2019-09-12 NOTE — Discharge Instructions (Addendum)
Please start taking MiraLAX daily.  I would recommend maintaining adequate hydration and increasing the amount of fiber in her diet.  Consider fresh fruit and vegetables.  Try to drink 64 ounces of water per day.  You might also want to consider electrolyte replacement beverages like Gatorade or Powerade.  These are high in sugars, so please monitor the amount of these the drink.  Follow-up with your primary care provider in 1 week if you find your symptoms have not began to improve with MiraLAX.  If you develop any worsening symptoms, you can always return to the emergency department for reevaluation.  It was a pleasure to meet you.

## 2019-09-12 NOTE — ED Triage Notes (Addendum)
Pt reports he was in bed and began to have abdominal spasms.  Pt states he has not had a "regular bowel movement" since the hospital after his stroke (3 weeks ago).  No neuro deficits noted at this time.

## 2019-09-28 ENCOUNTER — Inpatient Hospital Stay: Payer: BC Managed Care – PPO | Admitting: Adult Health

## 2019-10-11 ENCOUNTER — Ambulatory Visit (INDEPENDENT_AMBULATORY_CARE_PROVIDER_SITE_OTHER): Payer: Medicare Other | Admitting: Neurology

## 2019-10-11 ENCOUNTER — Encounter: Payer: Self-pay | Admitting: Neurology

## 2019-10-11 ENCOUNTER — Other Ambulatory Visit: Payer: Self-pay

## 2019-10-11 VITALS — BP 130/70 | HR 59 | Ht 68.5 in | Wt 263.0 lb

## 2019-10-11 DIAGNOSIS — I639 Cerebral infarction, unspecified: Secondary | ICD-10-CM | POA: Diagnosis not present

## 2019-10-11 DIAGNOSIS — I63232 Cerebral infarction due to unspecified occlusion or stenosis of left carotid arteries: Secondary | ICD-10-CM | POA: Diagnosis not present

## 2019-10-11 DIAGNOSIS — I63512 Cerebral infarction due to unspecified occlusion or stenosis of left middle cerebral artery: Secondary | ICD-10-CM | POA: Diagnosis not present

## 2019-10-11 NOTE — Progress Notes (Signed)
[facility] [address] [place]. [place] [address] [phone]       OFFICE FOLLOW-UP NOTE  [name] Date of Birth:  1950-06-09 Medical Record Number:  [id number]   HPI: [name] is a 69 year old African-American male seen today for initial office follow-up visit following hospital admission for stroke in June 2021.  Is accompanied by his wife.  History is obtained from them, review of electronic medical records and I personally reviewed available imaging films in PACS.Kevin Reynolds is a 69 y.o. male with past medical history significant for hypertension presented to the emergency department with slurred speech right-sided weakness on waking up at 3:30 in the morning of 08/24/19. Last known normal is 11:30 PM on 08/23/19 when patient went to bed. On waking up, slurred speech, right facial droop right-sided weakness and EMS was called. Blood pressure was 213 systolic. Glucose was 117. LVO score was 2-patient was brought to [name] ED. On initial assessment stroke scale is 4:  2 for facial droop, 1 for slurred speech, 1 yes right upper extremity drift.CT head showed nohemorrhage,was concerning for a hyperdense left MCA.CT angiogram was performed-demonstrated left ICA occlusion at the bulb and the left M1 nonocclusive thrombus. However,following CT head-patient symptoms completely resolved andNIHSS was 0 at 4.48 am.To evaluate area of risk-CT perfusion was performed which showed no cord and ischemic penumbra of 12 cc on rapid software(likely underestimated value). Patient was observed in the emergency room closely,around 5:15 AM symptoms began to recur. Patient scored an NIH stroke scale of 5.Now with mild aphasia. Spoke with wife who was present at bedside about risk versus benefit,and after obtaining verbal consent and discussion with interventional neuroradiologist Dr. Johnnye Sima taken to IR. NIHSS:4>>0>>5. Baseline MRS0.  He underwent  successful mechanical thrombectomy along with rescue left proximal carotid artery angioplasty and stenting.  He was admitted to the intensive care unit where blood pressure was tightly controlled.  He was extubated and did well.  Echocardiogram was normal.  LDL cholesterol 65 mg percent hemoglobin A1c was 6.0.  MRI scan of the brain showed patchy infarction involving posterior putamen, midportion of the carotid body and pontine involvement of deep insular cortex and frontoparietal junction white matter.  There is no hemorrhage.  He was started on aspirin and Brilinta.  He did so well that he had no therapy needs.  He is discharged home.  Continues to do well.  He is tolerating aspirin and Brilinta without any bleeding but only minor bruising.  He has some decreased temperature sensation on the right side but no other deficits.  He has been compliant with his antiplatelet therapy as well as does use a CPAP every night for his sleep apnea.  His blood pressures well controlled today it is 130/70.  Is tolerating Crestor well without muscle aches and pains.  He has not been eating healthy and has not lost any weight.  He is trying to increase his physical activity slowly. ROS:   14 system review of systems is positive for altered temperature sensation, paresthesias, numbness, sleep apnea and all other systems negative  PMH:  Past Medical History:  Diagnosis Date  . Arthritis    left shoulder  . Cataracts, bilateral   . Cough    related to allergies  . History of colonoscopy   . Hypertension    takes Amlodipine daily  . Joint pain    left  . Seasonal allergies    takes Claritin daily and Nasonex daily  Social History:  Social History   Socioeconomic History  . Marital status: Married    Spouse name: Not on file  . Number of children: Not on file  . Years of education: Not on file  . Highest education level: Not on file  Occupational History  . Not on file  Tobacco Use  . Smoking status:  Former Research scientist (life sciences)  . Smokeless tobacco: Never Used  . Tobacco comment: quit at age 57  Substance and Sexual Activity  . Alcohol use: Yes    Comment: occasionally  . Drug use: No  . Sexual activity: Yes  Other Topics Concern  . Not on file  Social History Narrative  . Not on file   Social Determinants of Health   Financial Resource Strain:   . Difficulty of Paying Living Expenses:   Food Insecurity:   . Worried About Charity fundraiser in the Last Year:   . Arboriculturist in the Last Year:   Transportation Needs:   . Film/video editor (Medical):   Marland Kitchen Lack of Transportation (Non-Medical):   Physical Activity:   . Days of Exercise per Week:   . Minutes of Exercise per Session:   Stress:   . Feeling of Stress :   Social Connections:   . Frequency of Communication with Friends and Family:   . Frequency of Social Gatherings with Friends and Family:   . Attends Religious Services:   . Active Member of Clubs or Organizations:   . Attends Archivist Meetings:   Marland Kitchen Marital Status:   Intimate Partner Violence:   . Fear of Current or Ex-Partner:   . Emotionally Abused:   Marland Kitchen Physically Abused:   . Sexually Abused:     Medications:   Current Outpatient Medications on File Prior to Visit  Medication Sig Dispense Refill  . acetaminophen (TYLENOL) 500 MG tablet Take 500-1,000 mg by mouth every 4 (four) hours as needed for mild pain or headache (or dental pain).    Marland Kitchen amLODipine (NORVASC) 10 MG tablet Take 10 mg by mouth daily.    Marland Kitchen aspirin 81 MG chewable tablet Chew 1 tablet (81 mg total) by mouth daily.    Marland Kitchen augmented betamethasone dipropionate (DIPROLENE-AF) 0.05 % cream Apply 1 application topically 2 (two) times daily as needed (to itchy or irritated sites).     . hydrALAZINE (APRESOLINE) 25 MG tablet Take 1 tablet (25 mg total) by mouth 3 (three) times daily. 90 tablet 2  . loratadine (CLARITIN) 10 MG tablet Take 10 mg by mouth daily as needed for allergies.    .  mometasone (NASONEX) 50 MCG/ACT nasal spray Place 2 sprays into the nose daily as needed (for allergies).     . rosuvastatin (CRESTOR) 10 MG tablet Take 10 mg by mouth at bedtime.    . ticagrelor (BRILINTA) 90 MG TABS tablet Take 1 tablet (90 mg total) by mouth 2 (two) times daily. 60 tablet 2  . XIIDRA 5 % SOLN Place 1 drop into both eyes in the morning and at bedtime.      No current facility-administered medications on file prior to visit.    Allergies:  No Known Allergies  Physical Exam General: Obese middle-age African-American male, seated, in no evident distress Head: head normocephalic and atraumatic.  Neck: supple with no carotid or supraclavicular bruits Cardiovascular: regular rate and rhythm, no murmurs Musculoskeletal: no deformity Skin:  no rash/petichiae Vascular:  Normal pulses all extremities Vitals:   10/11/19 0949  BP: (!) 130/70  Pulse: 59   Neurologic Exam Mental Status: Awake and fully alert. Oriented to place and time. Recent and remote memory intact. Attention span, concentration and fund of knowledge appropriate. Mood and affect appropriate.  Cranial Nerves: Fundoscopic exam reveals sharp disc margins. Pupils equal, briskly reactive to light. Extraocular movements full without nystagmus. Visual fields full to confrontation. Hearing intact. Facial sensation intact. Face, tongue, palate moves normally and symmetrically.  Motor: Normal bulk and tone. Normal strength in all tested extremity muscles. Sensory.: intact to touch ,pinprick .position and vibratory sensation.  Subjective altered temperature sensation in the right upper and lower extremity but no objective sensory loss. Coordination: Rapid alternating movements normal in all extremities. Finger-to-nose and heel-to-shin performed accurately bilaterally. Gait and Station: Arises from chair without difficulty. Stance is normal. Gait demonstrates normal stride length and balance . Able to heel, toe and tandem  walk without difficulty.  Reflexes: 1+ and symmetric. Toes downgoing.   NIHSS  0 Modified Rankin 1   ASSESSMENT: 69 year old African-American male with left MCA branch infarct in June 2021 secondary to left MCA occlusion embolization from proximal high-grade left artery stenosis treated with successful mechanical thrombectomy and rescue left carotid artery angioplasty and stenting was done extremely well with practically no deficits and full recovery.  Vascular risk factors of hypertension, hyperlipidemia, obesity, carotid stenosis and sleep apnea     PLAN: I had a long d/w patient and his wife about his recent stroke, left carotid stenosis, risk for recurrent stroke/TIAs, personally independently reviewed imaging studies and stroke evaluation results and answered questions.Continue aspirin 81 mg daily and Brilinta 90 mg twice daily for 6 months and then aspirin 81 mg daily alone for secondary stroke prevention and maintain strict control of hypertension with blood pressure goal below 130/90, diabetes with hemoglobin A1c goal below 6.5% and lipids with LDL cholesterol goal below 70 mg/dL. I also advised the patient to eat a healthy diet with plenty of whole grains, cereals, fruits and vegetables, exercise regularly and maintain ideal body weight.  He was counseled to be compliant with using his CPAP every night.  Followup in the future with my nurse practitioner Janett Billow in 3 months or call earlier if necessary. Greater than 50% of time during this 30 minute visit was spent on counseling,explanation of diagnosis embolic stroke, carotid stenosis, planning of further management, discussion with patient and family and coordination of care Antony Contras, MD  [facility] [address] [place], [place] [address]  Phone [phone] Fax [phone] Note: This document was prepared with digital dictation and possible smart phrase technology. Any transcriptional errors  that result from this process are unintentional

## 2019-10-11 NOTE — Patient Instructions (Signed)
I had a long d/w patient and his wife about his recent stroke, left carotid stenosis, risk for recurrent stroke/TIAs, personally independently reviewed imaging studies and stroke evaluation results and answered questions.Continue aspirin 81 mg daily and Brilinta 90 mg twice daily for 6 months and then aspirin 81 mg daily alone for secondary stroke prevention and maintain strict control of hypertension with blood pressure goal below 130/90, diabetes with hemoglobin A1c goal below 6.5% and lipids with LDL cholesterol goal below 70 mg/dL. I also advised the patient to eat a healthy diet with plenty of whole grains, cereals, fruits and vegetables, exercise regularly and maintain ideal body weight.  He was counseled to be compliant with using his CPAP every night.  Followup in the future with my nurse practitioner Janett Billow in 3 months or call earlier if necessary.  Stroke Prevention Some medical conditions and behaviors are associated with a higher chance of having a stroke. You can help prevent a stroke by making nutrition, lifestyle, and other changes, including managing any medical conditions you may have. What nutrition changes can be made?   Eat healthy foods. You can do this by: ? Choosing foods high in fiber, such as fresh fruits and vegetables and whole grains. ? Eating at least 5 or more servings of fruits and vegetables a day. Try to fill half of your plate at each meal with fruits and vegetables. ? Choosing lean protein foods, such as lean cuts of meat, poultry without skin, fish, tofu, beans, and nuts. ? Eating low-fat dairy products. ? Avoiding foods that are high in salt (sodium). This can help lower blood pressure. ? Avoiding foods that have saturated fat, trans fat, and cholesterol. This can help prevent high cholesterol. ? Avoiding processed and premade foods.  Follow your health care provider's specific guidelines for losing weight, controlling high blood pressure (hypertension), lowering  high cholesterol, and managing diabetes. These may include: ? Reducing your daily calorie intake. ? Limiting your daily sodium intake to 1,500 milligrams (mg). ? Using only healthy fats for cooking, such as olive oil, canola oil, or sunflower oil. ? Counting your daily carbohydrate intake. What lifestyle changes can be made?  Maintain a healthy weight. Talk to your health care provider about your ideal weight.  Get at least 30 minutes of moderate physical activity at least 5 days a week. Moderate activity includes brisk walking, biking, and swimming.  Do not use any products that contain nicotine or tobacco, such as cigarettes and e-cigarettes. If you need help quitting, ask your health care provider. It may also be helpful to avoid exposure to secondhand smoke.  Limit alcohol intake to no more than 1 drink a day for nonpregnant women and 2 drinks a day for men. One drink equals 12 oz of beer, 5 oz of wine, or 1 oz of hard liquor.  Stop any illegal drug use.  Avoid taking birth control pills. Talk to your health care provider about the risks of taking birth control pills if: ? You are over 5 years old. ? You smoke. ? You get migraines. ? You have ever had a blood clot. What other changes can be made?  Manage your cholesterol levels. ? Eating a healthy diet is important for preventing high cholesterol. If cholesterol cannot be managed through diet alone, you may also need to take medicines. ? Take any prescribed medicines to control your cholesterol as told by your health care provider.  Manage your diabetes. ? Eating a healthy diet and exercising regularly  are important parts of managing your blood sugar. If your blood sugar cannot be managed through diet and exercise, you may need to take medicines. ? Take any prescribed medicines to control your diabetes as told by your health care provider.  Control your hypertension. ? To reduce your risk of stroke, try to keep your blood  pressure below 130/80. ? Eating a healthy diet and exercising regularly are an important part of controlling your blood pressure. If your blood pressure cannot be managed through diet and exercise, you may need to take medicines. ? Take any prescribed medicines to control hypertension as told by your health care provider. ? Ask your health care provider if you should monitor your blood pressure at home. ? Have your blood pressure checked every year, even if your blood pressure is normal. Blood pressure increases with age and some medical conditions.  Get evaluated for sleep disorders (sleep apnea). Talk to your health care provider about getting a sleep evaluation if you snore a lot or have excessive sleepiness.  Take over-the-counter and prescription medicines only as told by your health care provider. Aspirin or blood thinners (antiplatelets or anticoagulants) may be recommended to reduce your risk of forming blood clots that can lead to stroke.  Make sure that any other medical conditions you have, such as atrial fibrillation or atherosclerosis, are managed. What are the warning signs of a stroke? The warning signs of a stroke can be easily remembered as BEFAST.  B is for balance. Signs include: ? Dizziness. ? Loss of balance or coordination. ? Sudden trouble walking.  E is for eyes. Signs include: ? A sudden change in vision. ? Trouble seeing.  F is for face. Signs include: ? Sudden weakness or numbness of the face. ? The face or eyelid drooping to one side.  A is for arms. Signs include: ? Sudden weakness or numbness of the arm, usually on one side of the body.  S is for speech. Signs include: ? Trouble speaking (aphasia). ? Trouble understanding.  T is for time. ? These symptoms may represent a serious problem that is an emergency. Do not wait to see if the symptoms will go away. Get medical help right away. Call your local emergency services (911 in the [place] Do not drive  yourself to the hospital.  Other signs of stroke may include: ? A sudden, severe headache with no known cause. ? Nausea or vomiting. ? Seizure. Where to find more information For more information, visit:  American Stroke Association: www.strokeassociation.org  National Stroke Association: www.stroke.org Summary  You can prevent a stroke by eating healthy, exercising, not smoking, limiting alcohol intake, and managing any medical conditions you may have.  Do not use any products that contain nicotine or tobacco, such as cigarettes and e-cigarettes. If you need help quitting, ask your health care provider. It may also be helpful to avoid exposure to secondhand smoke.  Remember BEFAST for warning signs of stroke. Get help right away if you or a loved one has any of these signs. This information is not intended to replace advice given to you by your health care provider. Make sure you discuss any questions you have with your health care provider. Document Revised: 02/13/2017 Document Reviewed: 04/08/2016 Elsevier Patient Education  2020 [employer].

## 2019-10-13 ENCOUNTER — Encounter: Payer: Medicare Other | Attending: Internal Medicine | Admitting: Skilled Nursing Facility1

## 2019-10-13 ENCOUNTER — Encounter: Payer: Self-pay | Admitting: Skilled Nursing Facility1

## 2019-10-13 ENCOUNTER — Other Ambulatory Visit: Payer: Self-pay

## 2019-10-13 DIAGNOSIS — Z0000 Encounter for general adult medical examination without abnormal findings: Secondary | ICD-10-CM | POA: Diagnosis not present

## 2019-10-13 DIAGNOSIS — Z125 Encounter for screening for malignant neoplasm of prostate: Secondary | ICD-10-CM | POA: Diagnosis not present

## 2019-10-13 DIAGNOSIS — G4733 Obstructive sleep apnea (adult) (pediatric): Secondary | ICD-10-CM | POA: Diagnosis not present

## 2019-10-13 DIAGNOSIS — I10 Essential (primary) hypertension: Secondary | ICD-10-CM | POA: Diagnosis not present

## 2019-10-13 DIAGNOSIS — E7800 Pure hypercholesterolemia, unspecified: Secondary | ICD-10-CM | POA: Diagnosis not present

## 2019-10-13 DIAGNOSIS — R7309 Other abnormal glucose: Secondary | ICD-10-CM | POA: Diagnosis not present

## 2019-10-13 DIAGNOSIS — I63232 Cerebral infarction due to unspecified occlusion or stenosis of left carotid arteries: Secondary | ICD-10-CM | POA: Diagnosis not present

## 2019-10-13 DIAGNOSIS — R600 Localized edema: Secondary | ICD-10-CM | POA: Diagnosis not present

## 2019-10-13 DIAGNOSIS — Z1389 Encounter for screening for other disorder: Secondary | ICD-10-CM | POA: Diagnosis not present

## 2019-10-13 NOTE — Progress Notes (Signed)
  Assessment:  Primary concerns today: CVA.   Pt states since his stroke he has been avoiding fried foods and eating smaller portions and cut back on salt and sugar and drinking mimnium 64 ounces of fluid and more lean meats. Pt states people from his church bring him food for dinner often.  Pt had specific questions which were answered to his satisfaction.    MEDICATIONS: see list   DIETARY INTAKE:  Usual eating pattern includes 3 meals and 1 snacks per day.  Everyday foods include none stated.  Avoided foods include cheese.    24-hr recall:  B ( AM): cheerios + banana and 1% milk or oatmeal + sugar or pancakes + preserves or boiled eggs sometimes fried eggs with canola oil or yogurt fruit granola Snk ( AM):  L ( PM): [place] sandwich + mayo and mustard +salad: tomatoes, onions, cucumbers, cheese or subway sub or watermelon or other fruit  Snk ( PM):  D ( PM): food brought to them Snk ( PM): sweets  Beverages:   Usual physical activity: Some walking  Estimated energy needs: 1800 calories   Progress Towards Goal(s):  In progress.   Education topics:  Animal fat verses plant fat Added sugars and their impact on heart health Physical activity recommendations and their impact on heart health    Intervention:  Nutrition cousneling. Dietitian educated pt on a healthy diet within the context of heart health and risk reduction of stroke.  Goals: ? First ingredients on label for bread: whole wheat flour ? Avoid hot dogs, lunch meat, and other processed meats ? Limit nuts to  cup per day ? Try some plant-based proteins in the frozen food aisle: [place], [place] [employer], impossible, [employer]  ? Avoid all animal based fats such as butter and sour cream ? Try 75% cocoa  ? Try plain no fat Greek yogurt to substitute sour cream ? Try plant-based creamer such as almond it is okay if you don't like ? Limit eating out to occasionally   Teaching Method Utilized:   Visual Auditory Hands on  Handouts given during visit include:  Detailed MyPlate  Barriers to learning/adherence to lifestyle change: none identified   Demonstrated degree of understanding via:  Teach Back   Monitoring/Evaluation:  Dietary intake, exercise, and body weight prn.

## 2019-11-23 ENCOUNTER — Other Ambulatory Visit: Payer: Self-pay

## 2019-11-23 NOTE — Patient Outreach (Signed)
[facility]) Care Management  11/23/2019  Eathon Valade 1950-11-01 [id number]   First telephone outreach attempt to obtain mRS. No answer. Left message for returned call.  [facility] Management Assistant [phone]

## 2019-11-29 ENCOUNTER — Other Ambulatory Visit: Payer: Self-pay

## 2019-11-29 NOTE — Patient Outreach (Signed)
[facility]) Care Management  11/29/2019  Kevin Reynolds 1951/02/25 [id number]   Second telephone outreach attempt to obtain mRS. No answer. Left message for returned call.  [facility] Management Assistant [phone]

## 2019-12-02 ENCOUNTER — Other Ambulatory Visit: Payer: Self-pay

## 2019-12-02 NOTE — Patient Outreach (Signed)
[facility]) Care Management  12/02/2019  Makayla Confer 06-Jun-1950 [id number]   Telephone outreach to patient to obtain mRS was successfully completed. MRS=1  [place] Management Assistant [phone]

## 2019-12-27 ENCOUNTER — Ambulatory Visit: Payer: Medicare Other | Attending: Internal Medicine

## 2019-12-27 DIAGNOSIS — Z23 Encounter for immunization: Secondary | ICD-10-CM

## 2019-12-27 NOTE — Progress Notes (Signed)
   Covid-19 Vaccination Clinic  Name:  Kevin Reynolds    MRN: [id number] DOB: Mar 20, 1950  12/27/2019  [name] was observed post Covid-19 immunization for 15 minutes without incident. He was provided with Vaccine Information Sheet and instruction to access the V-Safe system.   [name] was instructed to call 911 with any severe reactions post vaccine: Marland Kitchen Difficulty breathing  . Swelling of face and throat  . A fast heartbeat  . A bad rash all over body  . Dizziness and weakness

## 2020-01-12 DIAGNOSIS — Z23 Encounter for immunization: Secondary | ICD-10-CM | POA: Diagnosis not present

## 2020-01-30 ENCOUNTER — Ambulatory Visit: Payer: BC Managed Care – PPO | Admitting: Adult Health

## 2020-02-07 ENCOUNTER — Ambulatory Visit (INDEPENDENT_AMBULATORY_CARE_PROVIDER_SITE_OTHER): Payer: Medicare Other | Admitting: Adult Health

## 2020-02-07 ENCOUNTER — Encounter: Payer: Self-pay | Admitting: Adult Health

## 2020-02-07 VITALS — BP 125/61 | HR 62 | Ht 68.0 in | Wt 250.0 lb

## 2020-02-07 DIAGNOSIS — I63512 Cerebral infarction due to unspecified occlusion or stenosis of left middle cerebral artery: Secondary | ICD-10-CM

## 2020-02-07 DIAGNOSIS — I10 Essential (primary) hypertension: Secondary | ICD-10-CM

## 2020-02-07 DIAGNOSIS — E785 Hyperlipidemia, unspecified: Secondary | ICD-10-CM

## 2020-02-07 NOTE — Progress Notes (Signed)
[facility] [address] [place]. [place] [address] [phone]       OFFICE FOLLOW-UP NOTE  Kevin. Kevin Reynolds Date of Birth:  [birth year] Medical Record Number:  [id number]    Chief Complaint  Patient presents with  . Follow-up    stroke fu, tx rm ,alone, pt states he is doing well       HPI:   Today, 02/07/2020, Kevin Reynolds returns for 67-month stroke follow-up.  He has been doing well since prior visit with recovery of prior deficits.  He has returned back to all prior activities without difficulty.  He does report occasional short-term memory difficulty but does not interfere with daily activity or functioning.  Denies new stroke/TIA symptoms.  Remains on aspirin and Brilinta without bleeding or bruising.  He was to follow-up with Dr. Estanislado Pandy with repeat carotid ultrasound around October - per patient, unable to get through to the office and was contacted to schedule.  He remains on Crestor without myalgias.  Blood pressure today 125/61.  Reports nightly use of CPAP and ongoing participation in Steele trial.  He does report approximately 30 pound weight loss with ongoing weight loss goals.  No concerns at this time.   History provided for reference purposes only Initial visit 10/11/2019 Dr. Leonie Man: Kevin. Uhrich is a 69 year old African-American male seen today for initial office follow-up visit following hospital admission for stroke in June 2021.  Is accompanied by his wife.  History is obtained from them, review of electronic medical records and I personally reviewed available imaging films in PACS.Kevin Reynolds is a 68 y.o. male with past medical history significant for hypertension presented to the emergency department with slurred speech right-sided weakness on waking up at 3:30 in the morning of 08/24/19. Last known normal is 11:30 PM on 08/23/19 when patient went to bed. On waking up, slurred speech, right facial droop right-sided weakness and EMS was  called. Blood pressure was 706 systolic. Glucose was 117. LVO score was 2-patient was brought to [name]. On initial assessment stroke scale is 4:  2 for facial droop, 1 for slurred speech, 1 yes right upper extremity drift.CT head showed nohemorrhage,was concerning for a hyperdense left MCA.CT angiogram was performed-demonstrated left ICA occlusion at the bulb and the left M1 nonocclusive thrombus. However,following CT head-patient symptoms completely resolved andNIHSS was 0 at 4.48 am.To evaluate area of risk-CT perfusion was performed which showed no cord and ischemic penumbra of 12 cc on rapid software(likely underestimated value). Patient was observed in the emergency room closely,around 5:15 AM symptoms began to recur. Patient scored an NIH stroke scale of 5.Now with mild aphasia. Spoke with wife who was present at bedside about risk versus benefit,and after obtaining verbal consent and discussion with interventional neuroradiologist Dr. Johnnye Sima taken to IR. NIHSS:4>>0>>5. Baseline MRS0.  He underwent successful mechanical thrombectomy along with rescue left proximal carotid artery angioplasty and stenting.  He was admitted to the intensive care unit where blood pressure was tightly controlled.  He was extubated and did well.  Echocardiogram was normal.  LDL cholesterol 65 mg percent hemoglobin A1c was 6.0.  MRI scan of the brain showed patchy infarction involving posterior putamen, midportion of the carotid body and pontine involvement of deep insular cortex and frontoparietal junction white matter.  There is no hemorrhage.  He was started on aspirin and Brilinta.  He did so well that he had no therapy needs.  He is discharged home.  Continues to do well.  He is  tolerating aspirin and Brilinta without any bleeding but only minor bruising.  He has some decreased temperature sensation on the right side but no other deficits.  He has been compliant with his antiplatelet  therapy as well as does use a CPAP every night for his sleep apnea.  His blood pressures well controlled today it is 130/70.  Is tolerating Crestor well without muscle aches and pains.  He has not been eating healthy and has not lost any weight.  He is trying to increase his physical activity slowly.    ROS:   14 system review of systems is positive for those listed in HPI and all other systems negative  PMH:  Past Medical History:  Diagnosis Date  . Arthritis    left shoulder  . Cataracts, bilateral   . Cough    related to allergies  . History of colonoscopy   . Hypertension    takes Amlodipine daily  . Joint pain    left  . Seasonal allergies    takes Claritin daily and Nasonex daily    Social History:  Social History   Socioeconomic History  . Marital status: Married    Spouse name: Not on file  . Number of children: Not on file  . Years of education: Not on file  . Highest education level: Not on file  Occupational History  . Not on file  Tobacco Use  . Smoking status: Former Research scientist (life sciences)  . Smokeless tobacco: Never Used  . Tobacco comment: quit at age 89  Substance and Sexual Activity  . Alcohol use: Yes    Comment: occasionally  . Drug use: No  . Sexual activity: Yes  Other Topics Concern  . Not on file  Social History Narrative  . Not on file   Social Determinants of Health   Financial Resource Strain:   . Difficulty of Paying Living Expenses: Not on file  Food Insecurity:   . Worried About Charity fundraiser in the Last Year: Not on file  . Ran Out of Food in the Last Year: Not on file  Transportation Needs:   . Lack of Transportation (Medical): Not on file  . Lack of Transportation (Non-Medical): Not on file  Physical Activity:   . Days of Exercise per Week: Not on file  . Minutes of Exercise per Session: Not on file  Stress:   . Feeling of Stress : Not on file  Social Connections:   . Frequency of Communication with Friends and Family: Not on file   . Frequency of Social Gatherings with Friends and Family: Not on file  . Attends Religious Services: Not on file  . Active Member of Clubs or Organizations: Not on file  . Attends Archivist Meetings: Not on file  . Marital Status: Not on file  Intimate Partner Violence:   . Fear of Current or Ex-Partner: Not on file  . Emotionally Abused: Not on file  . Physically Abused: Not on file  . Sexually Abused: Not on file    Medications:   Current Outpatient Medications on File Prior to Visit  Medication Sig Dispense Refill  . acetaminophen (TYLENOL) 500 MG tablet Take 500-1,000 mg by mouth every 4 (four) hours as needed for mild pain or headache (or dental pain).    Marland Kitchen amLODipine (NORVASC) 10 MG tablet Take 10 mg by mouth daily.    Marland Kitchen aspirin 81 MG chewable tablet Chew 1 tablet (81 mg total) by mouth daily.    Marland Kitchen  augmented betamethasone dipropionate (DIPROLENE-AF) 0.05 % cream Apply 1 application topically 2 (two) times daily as needed (to itchy or irritated sites).     . hydrALAZINE (APRESOLINE) 25 MG tablet Take 1 tablet (25 mg total) by mouth 3 (three) times daily. 90 tablet 2  . loratadine (CLARITIN) 10 MG tablet Take 10 mg by mouth daily as needed for allergies.    . mometasone (NASONEX) 50 MCG/ACT nasal spray Place 2 sprays into the nose daily as needed (for allergies).     . rosuvastatin (CRESTOR) 10 MG tablet Take 10 mg by mouth at bedtime.    . ticagrelor (BRILINTA) 90 MG TABS tablet Take 1 tablet (90 mg total) by mouth 2 (two) times daily. 60 tablet 2  . XIIDRA 5 % SOLN Place 1 drop into both eyes in the morning and at bedtime.      No current facility-administered medications on file prior to visit.    Allergies:  No Known Allergies  Physical Exam Today's Vitals   02/07/20 1258  BP: 125/61  Pulse: 62  Weight: 250 lb (113.4 kg)  Height: 5\' 8"  (1.727 m)   Body mass index is 38.01 kg/m.  General: Obese pleasant middle-age African-American male, seated, in no  evident distress Head: head normocephalic and atraumatic.  Neck: supple with no carotid or supraclavicular bruits Cardiovascular: regular rate and rhythm, no murmurs Musculoskeletal: no deformity Skin:  no rash/petichiae Vascular:  Normal pulses all extremities  Neurologic Exam Mental Status: Awake and fully alert. Oriented to place and time. Recent and remote memory intact. Attention span, concentration and fund of knowledge appropriate. Mood and affect appropriate.  Cranial Nerves: Pupils equal, briskly reactive to light. Extraocular movements full without nystagmus. Visual fields full to confrontation. Hearing intact. Facial sensation intact. Face, tongue, palate moves normally and symmetrically.  Motor: Normal bulk and tone. Normal strength in all tested extremity muscles. Sensory.: intact to touch ,pinprick .position and vibratory sensation.   Coordination: Rapid alternating movements normal in all extremities. Finger-to-nose and heel-to-shin performed accurately bilaterally. Gait and Station: Arises from chair without difficulty. Stance is normal. Gait demonstrates normal stride length and balance . Able to heel, toe and tandem walk without difficulty.  Romberg negative. Reflexes: 1+ and symmetric. Toes downgoing.      ASSESSMENT/PLAN: 69 year old African-American male with left MCA branch infarct in June 2021 secondary to left MCA occlusion embolization from proximal high-grade left artery stenosis treated with successful mechanical thrombectomy and rescue left carotid artery angioplasty and stenting was done extremely well with full recovery.  Vascular risk factors of hypertension, hyperlipidemia, obesity, carotid stenosis and sleep apnea    -Continue aspirin and Brilinta for secondary stroke prevention and post stent for 6 months then aspirin alone as well as continuation of Crestor -will attempt to reach out to Dr. Estanislado Pandy scheduler to further assist patient to undergo carotid  ultrasound for surveillance -Advised ongoing use of CPAP for OSA management and participation in SleepSmart trial -Ongoing follow-up with PCP for aggressive stroke risk factor management including HTN with BP goal<130/90 and HLD with LDL goal<70  Follow-up in 6 months or call earlier if needed  CC:  [place] provider: Dr. Sedonia Small, MD   I spent 30 minutes of face-to-face and non-face-to-face time with patient.  This included previsit chart review, lab review, study review, order entry, electronic health record documentation, patient education and discussion regarding history of stroke, indication for surveillance monitoring with Dr. Estanislado Pandy, ongoing use of medications and importance of managing stroke  risk factors and answered all the questions to patient satisfaction  Frann Rider, [facility]  [facility] [address] [place], [place] [address]  Phone [phone] Fax [phone] Note: This document was prepared with digital dictation and possible smart phrase technology. Any transcriptional errors that result from this process are unintentional.

## 2020-02-07 NOTE — Progress Notes (Signed)
I agree with the above plan 

## 2020-02-07 NOTE — Patient Instructions (Addendum)
In regards to occassional short term memory concerns, recommend doing memory exercises, healthy diet, adequate sleep, use of CPAP and managing stroke risk factors  Ensure follow up with Dr. Estanislado Pandy - will attempt to reach out to his office to further assist with scheduled carotid ultrasound  Continue aspirin 81 mg daily and Brilinta (ticagrelor) 90 mg bid  and Crestor  for secondary stroke prevention  Continue to follow up with PCP regarding cholesterol and blood pressure management  Maintain strict control of hypertension with blood pressure goal below 130/90 and cholesterol with LDL cholesterol (bad cholesterol) goal below 70 mg/dL.      Followup in the future with me in 6 months or call earlier if needed      Thank you for coming to see [place] at [facility]. I hope we have been able to provide you high quality care today.  You may receive a patient satisfaction survey over the next few weeks. We would appreciate your feedback and comments so that we may continue to improve ourselves and the health of our patients.

## 2020-02-08 ENCOUNTER — Other Ambulatory Visit (HOSPITAL_COMMUNITY): Payer: Self-pay | Admitting: Interventional Radiology

## 2020-02-08 DIAGNOSIS — I63232 Cerebral infarction due to unspecified occlusion or stenosis of left carotid arteries: Secondary | ICD-10-CM

## 2020-02-10 ENCOUNTER — Telehealth (HOSPITAL_COMMUNITY): Payer: Self-pay | Admitting: Radiology

## 2020-02-10 NOTE — Telephone Encounter (Signed)
Called pt to schedule f/u US carotids per Deveshwar. No answer and no VM. JM

## 2020-02-28 ENCOUNTER — Other Ambulatory Visit (HOSPITAL_COMMUNITY): Payer: Self-pay | Admitting: Interventional Radiology

## 2020-02-28 ENCOUNTER — Other Ambulatory Visit: Payer: Self-pay

## 2020-02-28 ENCOUNTER — Ambulatory Visit (HOSPITAL_COMMUNITY)
Admission: RE | Admit: 2020-02-28 | Discharge: 2020-02-28 | Disposition: A | Discharge status: Home / Self Care | Payer: Medicare Other | Source: Ambulatory Visit | Attending: Interventional Radiology | Admitting: Interventional Radiology

## 2020-02-28 DIAGNOSIS — I639 Cerebral infarction, unspecified: Secondary | ICD-10-CM

## 2020-02-28 DIAGNOSIS — I63232 Cerebral infarction due to unspecified occlusion or stenosis of left carotid arteries: Secondary | ICD-10-CM | POA: Insufficient documentation

## 2020-02-29 ENCOUNTER — Telehealth (HOSPITAL_COMMUNITY): Payer: Self-pay | Admitting: Radiology

## 2020-02-29 NOTE — Telephone Encounter (Signed)
Tried to call pt, no answer and no VM. Pt will be due for US carotids per Deveshwar in 6 months time. JM

## 2020-03-08 ENCOUNTER — Telehealth: Payer: Self-pay | Admitting: Adult Health

## 2020-03-08 NOTE — Telephone Encounter (Signed)
Noted! Thank you

## 2020-03-08 NOTE — Telephone Encounter (Signed)
Pt called, July Dr. Leonie Man instructed me to take ticagrelor (BRILINTA) 90 MG TABS tablet for 6 months.  November; Dr. Darden Dates instructed me to discontinue ticagrelor (BRILINTA) 90 MG TABS tablet. Need clarity, would like a call from the nurse.

## 2020-03-08 NOTE — Telephone Encounter (Signed)
Called patient and clarified that per MD and NP's note he was to discontinue Brilinta after 6 months and continue ASA 81 mg. He stated the AVS confused him so he wanted to be sure. Patient had no other questions, verbalized understanding, appreciation.

## 2020-04-13 DIAGNOSIS — M545 Low back pain, unspecified: Secondary | ICD-10-CM | POA: Diagnosis not present

## 2020-04-13 DIAGNOSIS — I63232 Cerebral infarction due to unspecified occlusion or stenosis of left carotid arteries: Secondary | ICD-10-CM | POA: Diagnosis not present

## 2020-04-13 DIAGNOSIS — G4733 Obstructive sleep apnea (adult) (pediatric): Secondary | ICD-10-CM | POA: Diagnosis not present

## 2020-04-13 DIAGNOSIS — R7309 Other abnormal glucose: Secondary | ICD-10-CM | POA: Diagnosis not present

## 2020-04-13 DIAGNOSIS — I10 Essential (primary) hypertension: Secondary | ICD-10-CM | POA: Diagnosis not present

## 2020-04-13 DIAGNOSIS — E7800 Pure hypercholesterolemia, unspecified: Secondary | ICD-10-CM | POA: Diagnosis not present

## 2020-06-26 DIAGNOSIS — H5712 Ocular pain, left eye: Secondary | ICD-10-CM | POA: Diagnosis not present

## 2020-06-26 DIAGNOSIS — H04123 Dry eye syndrome of bilateral lacrimal glands: Secondary | ICD-10-CM | POA: Diagnosis not present

## 2020-08-07 ENCOUNTER — Encounter: Payer: Self-pay | Admitting: Adult Health

## 2020-08-07 ENCOUNTER — Ambulatory Visit (INDEPENDENT_AMBULATORY_CARE_PROVIDER_SITE_OTHER): Payer: Medicare Other | Admitting: Adult Health

## 2020-08-07 VITALS — BP 129/74 | HR 45 | Ht 68.0 in | Wt 242.0 lb

## 2020-08-07 DIAGNOSIS — I10 Essential (primary) hypertension: Secondary | ICD-10-CM | POA: Diagnosis not present

## 2020-08-07 DIAGNOSIS — Z9989 Dependence on other enabling machines and devices: Secondary | ICD-10-CM

## 2020-08-07 DIAGNOSIS — G4733 Obstructive sleep apnea (adult) (pediatric): Secondary | ICD-10-CM | POA: Diagnosis not present

## 2020-08-07 DIAGNOSIS — Z95828 Presence of other vascular implants and grafts: Secondary | ICD-10-CM | POA: Diagnosis not present

## 2020-08-07 DIAGNOSIS — Z9889 Other specified postprocedural states: Secondary | ICD-10-CM | POA: Diagnosis not present

## 2020-08-07 DIAGNOSIS — E785 Hyperlipidemia, unspecified: Secondary | ICD-10-CM

## 2020-08-07 DIAGNOSIS — I63512 Cerebral infarction due to unspecified occlusion or stenosis of left middle cerebral artery: Secondary | ICD-10-CM

## 2020-08-07 NOTE — Patient Instructions (Signed)
Continue aspirin 81 mg daily  and Crestor  for secondary stroke prevention  Ensure follow up with Dr. Estanislado Pandy for repeat carotid ultrasound next month - will reach out to his office to assist with scheduling   Continue to use CPAP for sleep apnea management  Continue to follow up with PCP regarding cholesterol and blood pressure management  Maintain strict control of hypertension with blood pressure goal below 130/90 and cholesterol with LDL cholesterol (bad cholesterol) goal below 70 mg/dL.         Thank you for coming to see Kevin Reynolds at [facility]. I hope we have been able to provide you high quality care today.  You may receive a patient satisfaction survey over the next few weeks. We would appreciate your feedback and comments so that we may continue to improve ourselves and the health of our patients.

## 2020-08-07 NOTE — Progress Notes (Signed)
[facility] [address] [place]. [place] [address] [phone]       OFFICE FOLLOW-UP NOTE  [name] Date of Birth:  12/27/1950 Medical Record Number:  [id number]    Chief Complaint  Patient presents with  . Follow-up    Tx rm, with wife, states he has improved, states he has more strength and energy       HPI:   Today, 08/07/2020, Kevin Reynolds returns for 68-month stroke follow-up accompanied by his wife  Doing well since prior visit without new or reoccurring stroke/TIA symptoms and denies any residual stroke deficits.  He has since had follow-up with Dr. Estanislado Pandy with repeat carotid ultrasound 12/20 which showed right ICA 1 to 39% stenosis and left ICA with patent stent without evidence of restenosis.  He has since completed Brilinta duration and remains on aspirin alone as well as Crestor without associated side effects.  Blood pressure today 129/74. SleepSmart trial completed but continues to use CPAP tolerating well.  No concerns at this time.    History provided for reference purposes only Update 02/07/2020 JM: Mr Reynolds returns for 36-month stroke follow-up.  He has been doing well since prior visit with recovery of prior deficits.  He has returned back to all prior activities without difficulty.  He does report occasional short-term memory difficulty but does not interfere with daily activity or functioning.  Denies new stroke/TIA symptoms.  Remains on aspirin and Brilinta without bleeding or bruising.  He was to follow-up with Dr. Estanislado Pandy with repeat carotid ultrasound around October - per patient, unable to get through to the office and was contacted to schedule.  He remains on Crestor without myalgias.  Blood pressure today 125/61.  Reports nightly use of CPAP and ongoing participation in [place] trial.  He does report approximately 30 pound weight loss with ongoing weight loss goals.  No concerns at this time.  Initial visit 10/11/2019  Dr. Leonie Man: Kevin Reynolds is a 70 year old African-American male seen today for initial office follow-up visit following hospital admission for stroke in June 2021.  Is accompanied by his wife.  History is obtained from them, review of electronic medical records and I personally reviewed available imaging films in PACS.Kevin Reynolds is a 70 y.o. male with past medical history significant for hypertension presented to the emergency department with slurred speech right-sided weakness on waking up at 3:30 in the morning of 08/24/19. Last known normal is 11:30 PM on 08/23/19 when patient went to bed. On waking up, slurred speech, right facial droop right-sided weakness and EMS was called. Blood pressure was 024 systolic. Glucose was 117. LVO score was 2-patient was brought to [name]. On initial assessment stroke scale is 4:  2 for facial droop, 1 for slurred speech, 1 yes right upper extremity drift.CT head showed nohemorrhage,was concerning for a hyperdense left MCA.CT angiogram was performed-demonstrated left ICA occlusion at the bulb and the left M1 nonocclusive thrombus. However,following CT head-patient symptoms completely resolved andNIHSS was 0 at 4.48 am.To evaluate area of risk-CT perfusion was performed which showed no cord and ischemic penumbra of 12 cc on rapid software(likely underestimated value). Patient was observed in the emergency room closely,around 5:15 AM symptoms began to recur. Patient scored an NIH stroke scale of 5.Now with mild aphasia. Spoke with wife who was present at bedside about risk versus benefit,and after obtaining verbal consent and discussion with interventional neuroradiologist Dr. Johnnye Sima taken to IR. NIHSS:4>>0>>5. Baseline MRS0.  He underwent successful mechanical thrombectomy along  with rescue left proximal carotid artery angioplasty and stenting.  He was admitted to the intensive care unit where blood pressure was tightly controlled.  He was  extubated and did well.  Echocardiogram was normal.  LDL cholesterol 65 mg percent hemoglobin A1c was 6.0.  MRI scan of the brain showed patchy infarction involving posterior putamen, midportion of the carotid body and pontine involvement of deep insular cortex and frontoparietal junction white matter.  There is no hemorrhage.  He was started on aspirin and Brilinta.  He did so well that he had no therapy needs.  He is discharged home.  Continues to do well.  He is tolerating aspirin and Brilinta without any bleeding but only minor bruising.  He has some decreased temperature sensation on the right side but no other deficits.  He has been compliant with his antiplatelet therapy as well as does use a CPAP every night for his sleep apnea.  His blood pressures well controlled today it is 130/70.  Is tolerating Crestor well without muscle aches and pains.  He has not been eating healthy and has not lost any weight.  He is trying to increase his physical activity slowly.    ROS:   14 system review of systems is positive for those listed in HPI and all other systems negative  PMH:  Past Medical History:  Diagnosis Date  . Arthritis    left shoulder  . Cataracts, bilateral   . Cough    related to allergies  . History of colonoscopy   . Hypertension    takes Amlodipine daily  . Joint pain    left  . Seasonal allergies    takes Claritin daily and Nasonex daily    Social History:  Social History   Socioeconomic History  . Marital status: Married    Spouse name: Not on file  . Number of children: Not on file  . Years of education: Not on file  . Highest education level: Not on file  Occupational History  . Not on file  Tobacco Use  . Smoking status: Former Research scientist (life sciences)  . Smokeless tobacco: Never Used  . Tobacco comment: quit at age 38  Substance and Sexual Activity  . Alcohol use: Yes    Comment: occasionally  . Drug use: No  . Sexual activity: Yes  Other Topics Concern  . Not on file   Social History Narrative  . Not on file   Social Determinants of Health   Financial Resource Strain: Not on file  Food Insecurity: Not on file  Transportation Needs: Not on file  Physical Activity: Not on file  Stress: Not on file  Social Connections: Not on file  Intimate Partner Violence: Not on file    Medications:   Current Outpatient Medications on File Prior to Visit  Medication Sig Dispense Refill  . acetaminophen (TYLENOL) 500 MG tablet Take 500-1,000 mg by mouth every 4 (four) hours as needed for mild pain or headache (or dental pain).    Marland Kitchen amLODipine (NORVASC) 10 MG tablet Take 10 mg by mouth daily.    Marland Kitchen aspirin 81 MG chewable tablet Chew 1 tablet (81 mg total) by mouth daily.    Marland Kitchen augmented betamethasone dipropionate (DIPROLENE-AF) 0.05 % cream Apply 1 application topically 2 (two) times daily as needed (to itchy or irritated sites).     . loratadine (CLARITIN) 10 MG tablet Take 10 mg by mouth daily as needed for allergies.    . mometasone (NASONEX) 50 MCG/ACT nasal  spray Place 2 sprays into the nose daily as needed (for allergies).     . rosuvastatin (CRESTOR) 10 MG tablet Take 10 mg by mouth at bedtime.    Marland Kitchen XIIDRA 5 % SOLN Place 1 drop into both eyes in the morning and at bedtime.      No current facility-administered medications on file prior to visit.    Allergies:  No Known Allergies  Physical Exam Today's Vitals   08/07/20 0938  BP: 129/74  Pulse: (!) 45  Weight: 242 lb (109.8 kg)  Height: 5\' 8"  (1.727 m)   Body mass index is 36.8 kg/m.  General: Obese pleasant middle-age African-American male, seated, in no evident distress Head: head normocephalic and atraumatic.  Neck: supple with no carotid or supraclavicular bruits Cardiovascular: regular rate and rhythm, no murmurs Musculoskeletal: no deformity Skin:  no rash/petichiae Vascular:  Normal pulses all extremities  Neurologic Exam Mental Status: Awake and fully alert.  Fluent speech and  language.  Oriented to place and time. Recent and remote memory intact. Attention span, concentration and fund of knowledge appropriate. Mood and affect appropriate.  Cranial Nerves: Pupils equal, briskly reactive to light. Extraocular movements full without nystagmus. Visual fields full to confrontation. Hearing intact. Facial sensation intact. Face, tongue, palate moves normally and symmetrically.  Motor: Normal bulk and tone. Normal strength in all tested extremity muscles. Sensory.: intact to touch ,pinprick .position and vibratory sensation.   Coordination: Rapid alternating movements normal in all extremities. Finger-to-nose and heel-to-shin performed accurately bilaterally. Gait and Station: Arises from chair without difficulty. Stance is normal. Gait demonstrates normal stride length and balance . Able to heel, toe and tandem walk without difficulty.  Romberg negative. Reflexes: 1+ and symmetric. Toes downgoing.      ASSESSMENT/PLAN: 70 year old African-American male with left MCA branch infarct in June 2021 secondary to left MCA occlusion embolization from proximal high-grade left artery stenosis treated with successful mechanical thrombectomy and rescue left carotid artery angioplasty and stenting was done extremely well with full recovery.  Vascular risk factors of hypertension, hyperlipidemia, obesity, carotid stenosis and sleep apnea   1.  Left MCA stroke -Recovered well without residual deficit -Continue aspirin and Crestor for secondary stroke prevention -Ongoing follow-up with PCP for aggressive stroke risk factor management including HTN with BP goal<130/90 and HLD with LDL goal<70 -Lipid panel 03/2020 LDL 69 A1c 03/2020 5.5  2.  Left carotid stenosis s/p stent -Carotid duplex 12/14 showed patent left ICA stent and no evidence of stenosis -Recommended 22-month follow-up for carotid duplex - will reach out to Dr. Arlean Hopping office to assist with scheduling (they attempted to call  him back in 02/2020 but patient missed phone call) -Discussed importance of ongoing use of aspirin and statin as well as managing stroke risk factors  3.  OSA -SleepSmart trial completed -Advised continued use of CPAP for sleep apnea management   Overall stable from stroke standpoint and recommend follow-up on an as-needed basis    CC:  [facility] provider: Dr. Sedonia Small, MD    Frann Rider, [facility]  [facility] [address] [place], [place] [address]  Phone [phone] Fax [phone] Note: This document was prepared with digital dictation and possible smart phrase technology. Any transcriptional errors that result from this process are unintentional.

## 2020-08-07 NOTE — Progress Notes (Signed)
I agree with the above plan 

## 2020-08-27 ENCOUNTER — Telehealth: Payer: Self-pay | Admitting: *Deleted

## 2020-08-27 NOTE — Telephone Encounter (Signed)
Sharyn Lull from check in came back to my desk in relation to patient Kevin Reynolds.  Left message for Anderson Malta at Dr. Arlean Hopping office about repeating carotid ultrasound, patient is on Brilinta.  I relayed will f/u on this for pt. From 01-2020 noted they have tried to call pt and no answer or has not called back to schedule.

## 2020-08-27 NOTE — Telephone Encounter (Signed)
LMVM for Anderson Malta calling about pt scheduling carotid US for him.

## 2020-08-28 NOTE — Telephone Encounter (Signed)
I have called Miles Costain at [phone] and Gildardo Pounds [phone] re: this pt and f/u on carotid US for this pt.  (He came by the office yesterday).  I also sent staff message relating to this as well.

## 2020-08-28 NOTE — Telephone Encounter (Signed)
Thornton Park, RN; Miles Costain L He is due for f/u. I have sent a request to get his exam authorized. Waiting on insurance. I will call him to set it up.   Thanks,  Ash    I called wife of pt.  Relayed this information.  She will have pt return call.  (She took the information down).

## 2020-08-28 NOTE — Telephone Encounter (Signed)
Patient returned call.  I relayed that I had spoken to [facility] with Dr. Estanislado Pandy office via staff message. they have ordered the test ( carotid ultrasound) waiting for authorization.  Then they will call him back.  I relayed this to him I gave him Kevin Reynolds name and phone number.  I told him to call me back after a week if he has not heard from them and I will send another message to her via staff message.  He verbalized understanding.

## 2020-08-29 ENCOUNTER — Other Ambulatory Visit (HOSPITAL_COMMUNITY): Payer: Self-pay | Admitting: Interventional Radiology

## 2020-08-29 ENCOUNTER — Telehealth (HOSPITAL_COMMUNITY): Payer: Self-pay

## 2020-08-29 DIAGNOSIS — I63232 Cerebral infarction due to unspecified occlusion or stenosis of left carotid arteries: Secondary | ICD-10-CM

## 2020-08-29 NOTE — Telephone Encounter (Signed)
Called to schedule us carotid, no answer, left vm. AW  

## 2020-09-05 ENCOUNTER — Ambulatory Visit (HOSPITAL_COMMUNITY)
Admission: RE | Admit: 2020-09-05 | Discharge: 2020-09-05 | Disposition: A | Discharge status: Home / Self Care | Payer: Medicare Other | Source: Ambulatory Visit | Attending: Interventional Radiology | Admitting: Interventional Radiology

## 2020-09-05 ENCOUNTER — Other Ambulatory Visit: Payer: Self-pay

## 2020-09-05 DIAGNOSIS — I63232 Cerebral infarction due to unspecified occlusion or stenosis of left carotid arteries: Secondary | ICD-10-CM | POA: Diagnosis not present

## 2020-09-05 NOTE — Progress Notes (Signed)
Carotid artery duplex completed. Refer to "CV Proc" under chart review to view preliminary results.  09/05/2020 9:43 AM Kelby Aline., MHA, RVT, RDCS, RDMS

## 2020-09-10 ENCOUNTER — Telehealth (HOSPITAL_COMMUNITY): Payer: Self-pay

## 2020-09-10 NOTE — Telephone Encounter (Signed)
Pt agreed to f/u in 3 months with US carotid. AW

## 2020-09-11 DIAGNOSIS — G4733 Obstructive sleep apnea (adult) (pediatric): Secondary | ICD-10-CM | POA: Insufficient documentation

## 2020-09-11 DIAGNOSIS — L308 Other specified dermatitis: Secondary | ICD-10-CM | POA: Diagnosis not present

## 2020-09-11 DIAGNOSIS — M5430 Sciatica, unspecified side: Secondary | ICD-10-CM | POA: Insufficient documentation

## 2020-10-25 DIAGNOSIS — R7303 Prediabetes: Secondary | ICD-10-CM | POA: Diagnosis not present

## 2020-10-25 DIAGNOSIS — R7309 Other abnormal glucose: Secondary | ICD-10-CM | POA: Diagnosis not present

## 2020-10-25 DIAGNOSIS — E7800 Pure hypercholesterolemia, unspecified: Secondary | ICD-10-CM | POA: Diagnosis not present

## 2020-10-25 DIAGNOSIS — Z0000 Encounter for general adult medical examination without abnormal findings: Secondary | ICD-10-CM | POA: Diagnosis not present

## 2020-10-25 DIAGNOSIS — I63232 Cerebral infarction due to unspecified occlusion or stenosis of left carotid arteries: Secondary | ICD-10-CM | POA: Diagnosis not present

## 2020-10-25 DIAGNOSIS — R600 Localized edema: Secondary | ICD-10-CM | POA: Diagnosis not present

## 2020-10-25 DIAGNOSIS — G4733 Obstructive sleep apnea (adult) (pediatric): Secondary | ICD-10-CM | POA: Diagnosis not present

## 2020-10-25 DIAGNOSIS — I10 Essential (primary) hypertension: Secondary | ICD-10-CM | POA: Diagnosis not present

## 2020-10-25 DIAGNOSIS — Z125 Encounter for screening for malignant neoplasm of prostate: Secondary | ICD-10-CM | POA: Diagnosis not present

## 2021-01-30 DIAGNOSIS — Z961 Presence of intraocular lens: Secondary | ICD-10-CM | POA: Diagnosis not present

## 2021-01-30 DIAGNOSIS — H4321 Crystalline deposits in vitreous body, right eye: Secondary | ICD-10-CM | POA: Diagnosis not present

## 2021-02-05 ENCOUNTER — Telehealth (HOSPITAL_COMMUNITY): Payer: Self-pay

## 2021-02-05 NOTE — Telephone Encounter (Signed)
Called to schedule us carotid, no answer, left vm. AW  

## 2021-02-13 ENCOUNTER — Telehealth (HOSPITAL_COMMUNITY): Payer: Self-pay

## 2021-02-13 ENCOUNTER — Other Ambulatory Visit (HOSPITAL_COMMUNITY): Payer: Self-pay | Admitting: Interventional Radiology

## 2021-02-13 DIAGNOSIS — I63232 Cerebral infarction due to unspecified occlusion or stenosis of left carotid arteries: Secondary | ICD-10-CM

## 2021-02-13 NOTE — Telephone Encounter (Signed)
Called to schedule us carotid, no answer, left vm. AW  

## 2021-02-20 ENCOUNTER — Other Ambulatory Visit: Payer: Self-pay

## 2021-02-20 ENCOUNTER — Ambulatory Visit (HOSPITAL_COMMUNITY)
Admission: RE | Admit: 2021-02-20 | Discharge: 2021-02-20 | Disposition: A | Discharge status: Home / Self Care | Payer: Medicare Other | Source: Ambulatory Visit | Attending: Interventional Radiology | Admitting: Interventional Radiology

## 2021-02-20 DIAGNOSIS — I63232 Cerebral infarction due to unspecified occlusion or stenosis of left carotid arteries: Secondary | ICD-10-CM | POA: Diagnosis not present

## 2021-02-20 NOTE — Progress Notes (Signed)
Carotid duplex has been completed.   Preliminary results in CV Proc.   Kevin Reynolds 02/20/2021 10:23 AM

## 2021-02-25 ENCOUNTER — Telehealth (HOSPITAL_COMMUNITY): Payer: Self-pay

## 2021-02-25 NOTE — Telephone Encounter (Signed)
Pt agreed to f/u in 6 months with us carotid. AW 

## 2021-05-01 DIAGNOSIS — G4733 Obstructive sleep apnea (adult) (pediatric): Secondary | ICD-10-CM | POA: Diagnosis not present

## 2021-05-01 DIAGNOSIS — I10 Essential (primary) hypertension: Secondary | ICD-10-CM | POA: Diagnosis not present

## 2021-05-01 DIAGNOSIS — R7301 Impaired fasting glucose: Secondary | ICD-10-CM | POA: Diagnosis not present

## 2021-05-01 DIAGNOSIS — E7800 Pure hypercholesterolemia, unspecified: Secondary | ICD-10-CM | POA: Diagnosis not present

## 2021-05-01 DIAGNOSIS — R7309 Other abnormal glucose: Secondary | ICD-10-CM | POA: Diagnosis not present

## 2021-05-01 DIAGNOSIS — R600 Localized edema: Secondary | ICD-10-CM | POA: Diagnosis not present

## 2021-05-01 DIAGNOSIS — I63232 Cerebral infarction due to unspecified occlusion or stenosis of left carotid arteries: Secondary | ICD-10-CM | POA: Diagnosis not present

## 2021-09-09 ENCOUNTER — Telehealth (HOSPITAL_COMMUNITY): Payer: Self-pay

## 2021-09-09 NOTE — Telephone Encounter (Signed)
Called to schedule us carotid, no answer, left vm. AW  

## 2021-09-16 ENCOUNTER — Other Ambulatory Visit (HOSPITAL_COMMUNITY): Payer: Self-pay | Admitting: Interventional Radiology

## 2021-09-16 DIAGNOSIS — I63232 Cerebral infarction due to unspecified occlusion or stenosis of left carotid arteries: Secondary | ICD-10-CM

## 2021-09-30 ENCOUNTER — Ambulatory Visit (HOSPITAL_COMMUNITY)
Admission: RE | Admit: 2021-09-30 | Discharge: 2021-09-30 | Disposition: A | Discharge status: Home / Self Care | Payer: Medicare Other | Source: Ambulatory Visit | Attending: Interventional Radiology | Admitting: Interventional Radiology

## 2021-09-30 DIAGNOSIS — I63232 Cerebral infarction due to unspecified occlusion or stenosis of left carotid arteries: Secondary | ICD-10-CM | POA: Insufficient documentation

## 2021-09-30 NOTE — Progress Notes (Signed)
VASCULAR LAB    Carotid duplex has been performed.  See CV proc for preliminary results.   Zarria Towell, RVT 09/30/2021, 1:54 PM

## 2021-10-22 DIAGNOSIS — E8770 Fluid overload, unspecified: Secondary | ICD-10-CM | POA: Diagnosis not present

## 2021-10-22 DIAGNOSIS — R600 Localized edema: Secondary | ICD-10-CM | POA: Diagnosis not present

## 2021-11-05 ENCOUNTER — Telehealth (HOSPITAL_COMMUNITY): Payer: Self-pay

## 2021-11-05 NOTE — Telephone Encounter (Signed)
Called pt regarding recent imaging, no answer, left vm. AW  

## 2021-11-19 ENCOUNTER — Telehealth (HOSPITAL_COMMUNITY): Payer: Self-pay

## 2021-11-19 NOTE — Telephone Encounter (Signed)
Pt agreed to f/u in 6 months with us carotid. AW 

## 2021-11-27 DIAGNOSIS — R7309 Other abnormal glucose: Secondary | ICD-10-CM | POA: Diagnosis not present

## 2021-11-27 DIAGNOSIS — Z23 Encounter for immunization: Secondary | ICD-10-CM | POA: Diagnosis not present

## 2021-11-27 DIAGNOSIS — I63232 Cerebral infarction due to unspecified occlusion or stenosis of left carotid arteries: Secondary | ICD-10-CM | POA: Diagnosis not present

## 2021-11-27 DIAGNOSIS — Z125 Encounter for screening for malignant neoplasm of prostate: Secondary | ICD-10-CM | POA: Diagnosis not present

## 2021-11-27 DIAGNOSIS — R7303 Prediabetes: Secondary | ICD-10-CM | POA: Diagnosis not present

## 2021-11-27 DIAGNOSIS — E7800 Pure hypercholesterolemia, unspecified: Secondary | ICD-10-CM | POA: Diagnosis not present

## 2021-11-27 DIAGNOSIS — Z0000 Encounter for general adult medical examination without abnormal findings: Secondary | ICD-10-CM | POA: Diagnosis not present

## 2021-11-27 DIAGNOSIS — G4733 Obstructive sleep apnea (adult) (pediatric): Secondary | ICD-10-CM | POA: Diagnosis not present

## 2021-11-27 DIAGNOSIS — I10 Essential (primary) hypertension: Secondary | ICD-10-CM | POA: Diagnosis not present

## 2022-02-19 DIAGNOSIS — L308 Other specified dermatitis: Secondary | ICD-10-CM | POA: Diagnosis not present

## 2022-02-19 DIAGNOSIS — R600 Localized edema: Secondary | ICD-10-CM | POA: Diagnosis not present

## 2022-02-19 DIAGNOSIS — L309 Dermatitis, unspecified: Secondary | ICD-10-CM | POA: Diagnosis not present

## 2022-02-19 DIAGNOSIS — L819 Disorder of pigmentation, unspecified: Secondary | ICD-10-CM | POA: Diagnosis not present

## 2022-03-15 IMAGING — CT CT HEAD CODE STROKE
3 series · 15 of 47 positions shown, 18 images · non-contrast
Comparison: None.

CLINICAL DATA: Code stroke. Ataxia with stroke suspected. Slurred
speech

EXAM:
CT HEAD WITHOUT CONTRAST
TECHNIQUE: Contiguous axial images were obtained from the base of the skull
through the vertex without intravenous contrast.

[Series 3: head 5.0 st · axial · 0.46mm/px · z∈[-104,+36]mm · 9 of 34 slices shown, 12 images]
[im 3/34  brain]
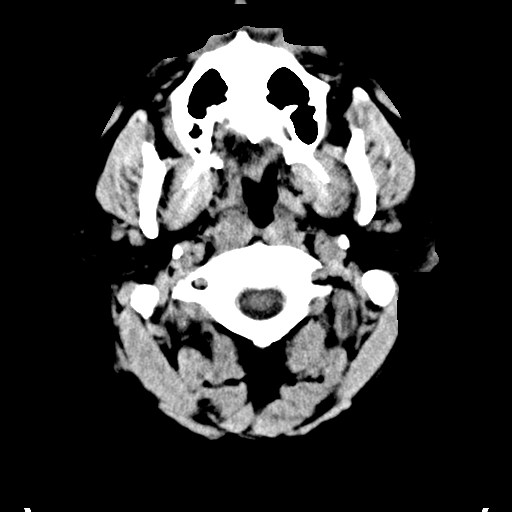
[im 3/34  bone]
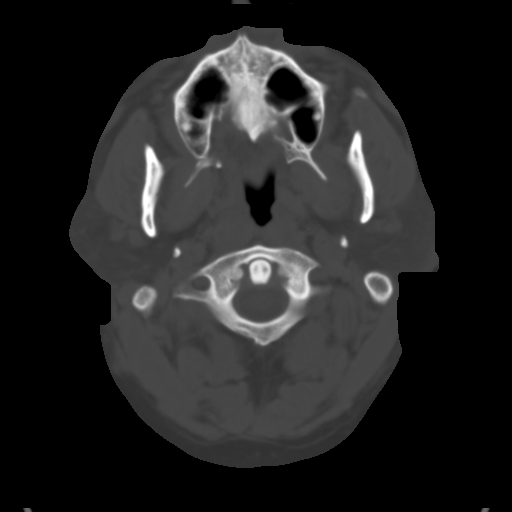
[im 6/34  brain]
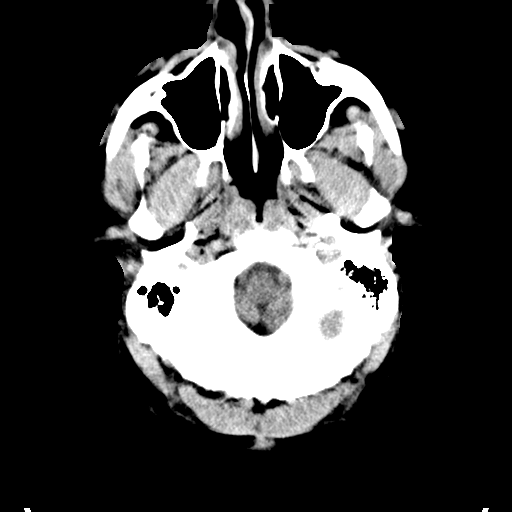
[im 10/34  brain]
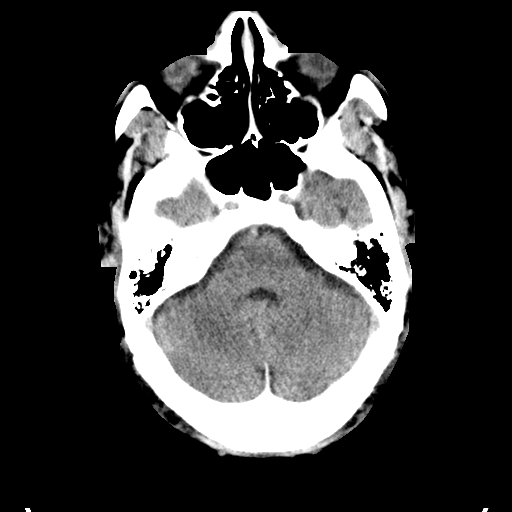
[im 13/34  brain]
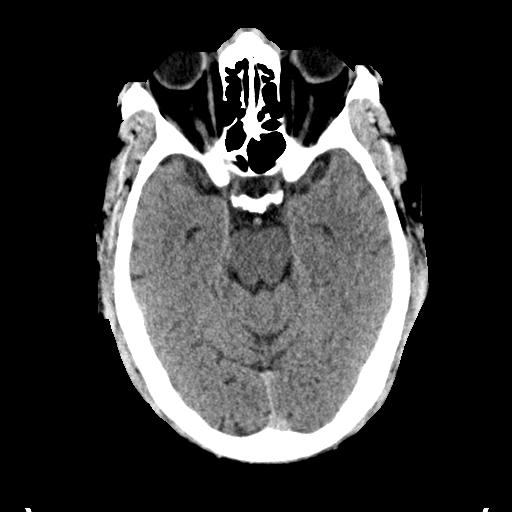
[im 18/34  brain]
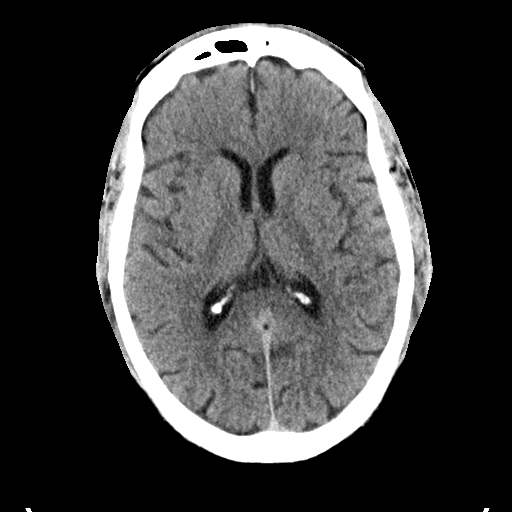
[im 18/34  bone]
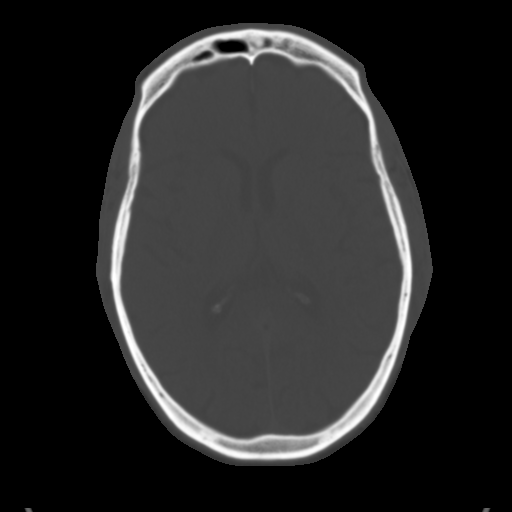
[im 21/34  brain]
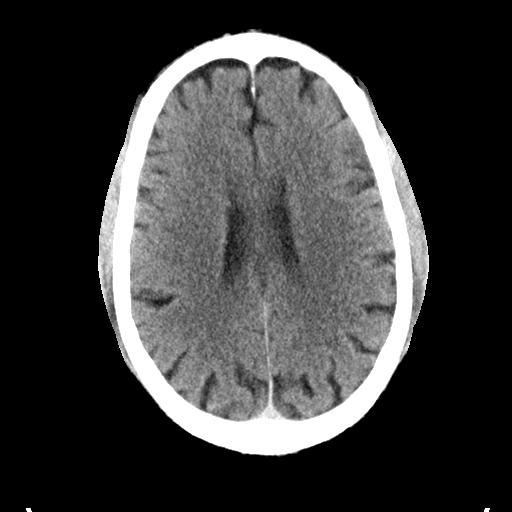
[im 24/34  brain]
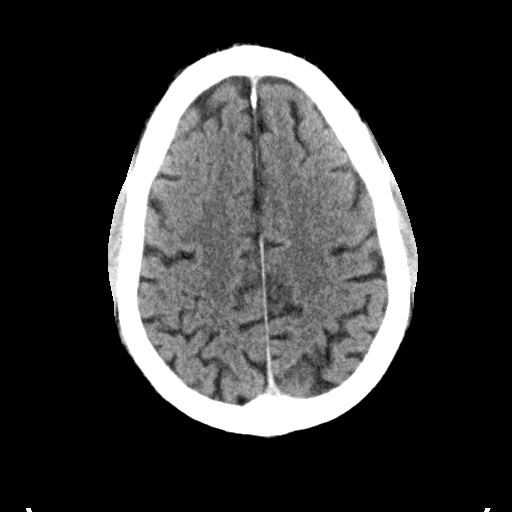
[im 28/34  brain]
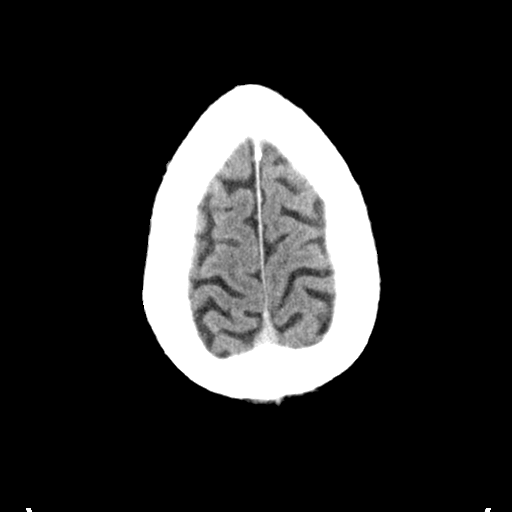
[im 31/34  brain]
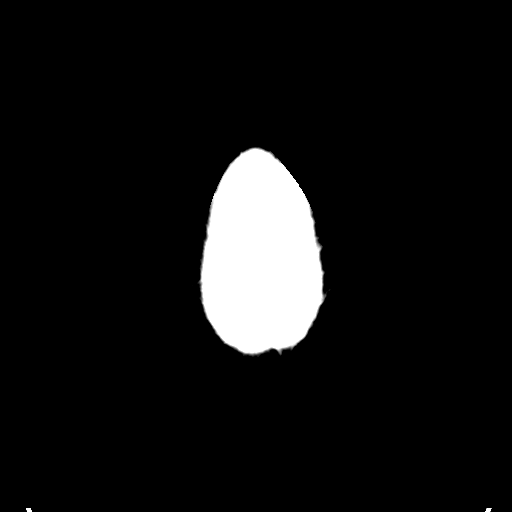
[im 31/34  bone]
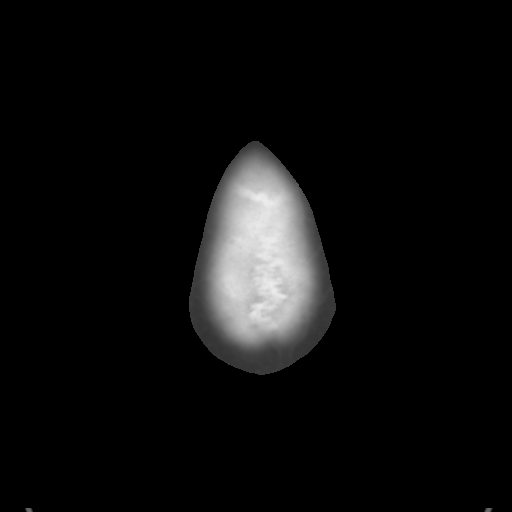

[Series 5: head 3.0 cor st · coronal · 0.34mm/px · 3 of 81 slices shown]
[im 27/81  brain]
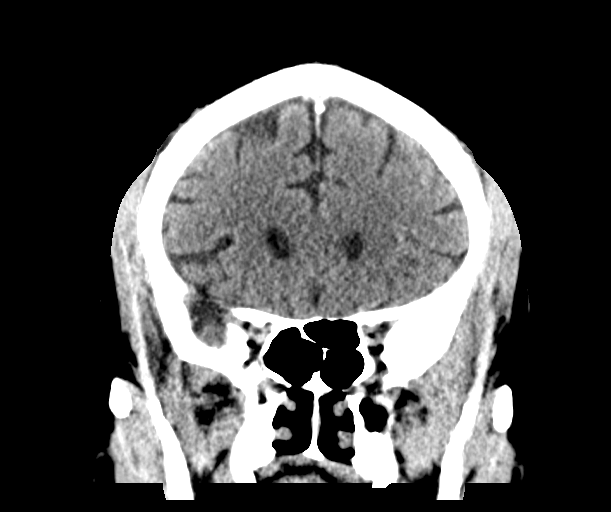
[im 36/81  brain]
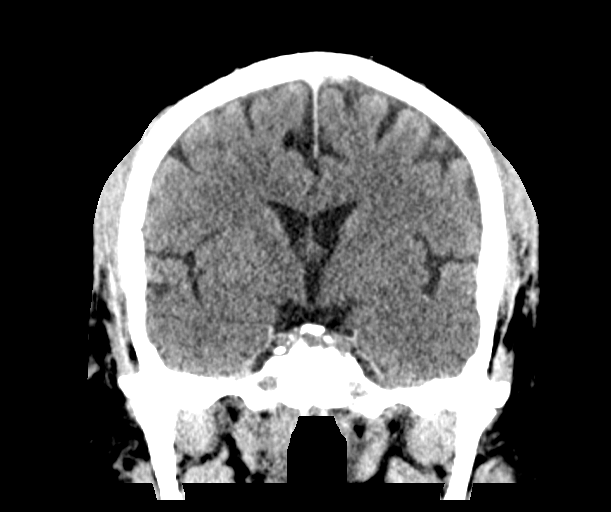
[im 45/81  brain]
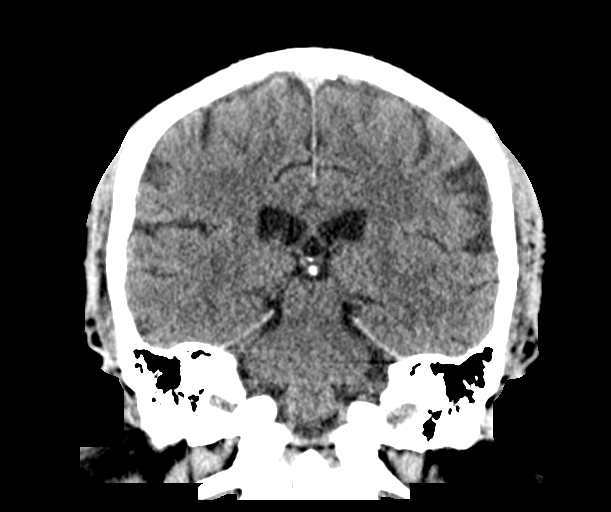

[Series 6: head 3.0 sag st · sagittal · 0.33mm/px · 3 of 67 slices shown]
[im 23/67  brain]
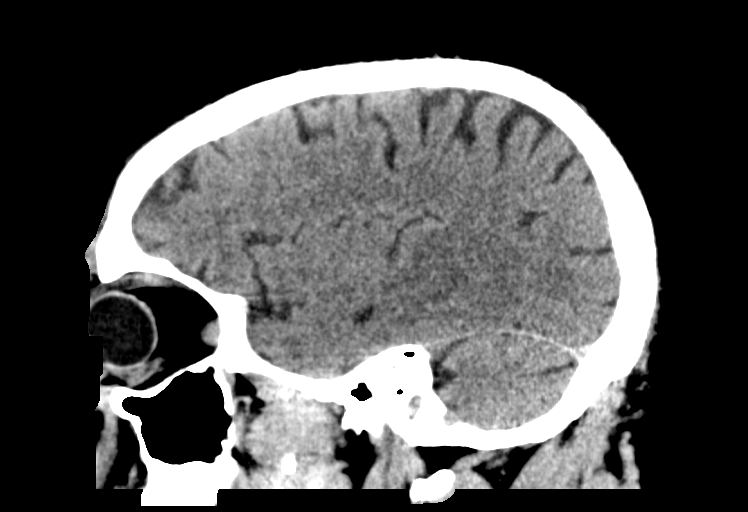
[im 34/67  brain]
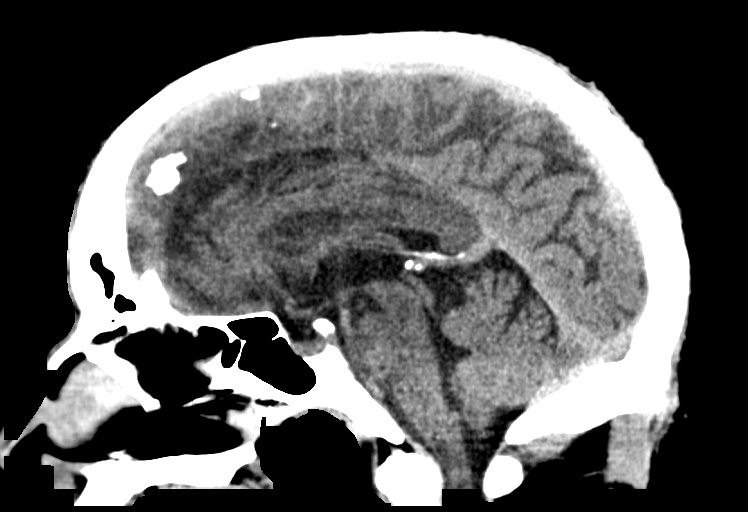
[im 45/67  brain]
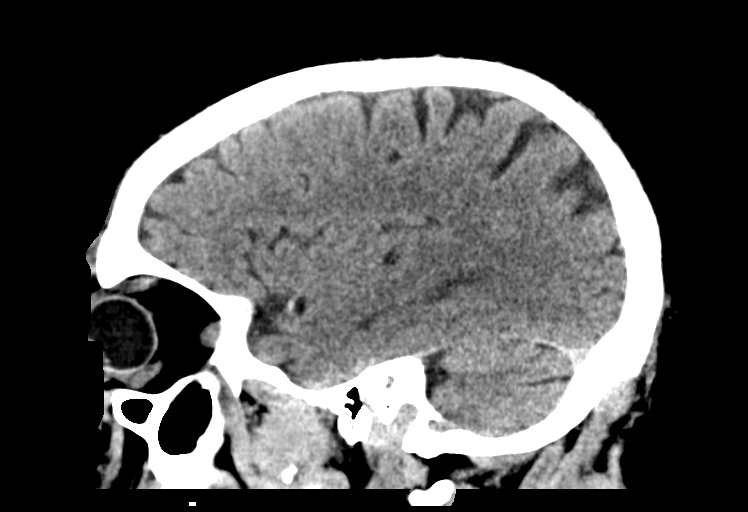

[15 of 47 positions shown; findings below may reference images not displayed]

FINDINGS: Brain: No evidence of acute infarction, hemorrhage, hydrocephalus,
extra-axial collection or mass lesion/mass effect.

Vascular: Dense left proximal MCA

Skull: Negative

Sinuses/Orbits: Negative

Other: These results were communicated to Dr. Schultz at [DATE] [facility]
08/24/2019by text page via the [employer] messaging system. CTA is already
pending

ASPECTS (Alberta Stroke Program Early CT Score)

- Ganglionic level infarction (caudate, lentiform nuclei, internal
capsule, insula, M1-M3 cortex): 7

- Supraganglionic infarction (M4-M6 cortex): 3

Total score (0-10 with 10 being normal): 10
IMPRESSION: Dense left MCA with no visible infarct. No intracranial hemorrhage.
ASPECTS is 10.

## 2022-04-03 IMAGING — DX DG ABD PORTABLE 1V
1 series · 1 of 1 positions shown · non-contrast
Comparison: None.

CLINICAL DATA: Constipation.

EXAM:
PORTABLE ABDOMEN - 1 VIEW

[t abdomen supine]
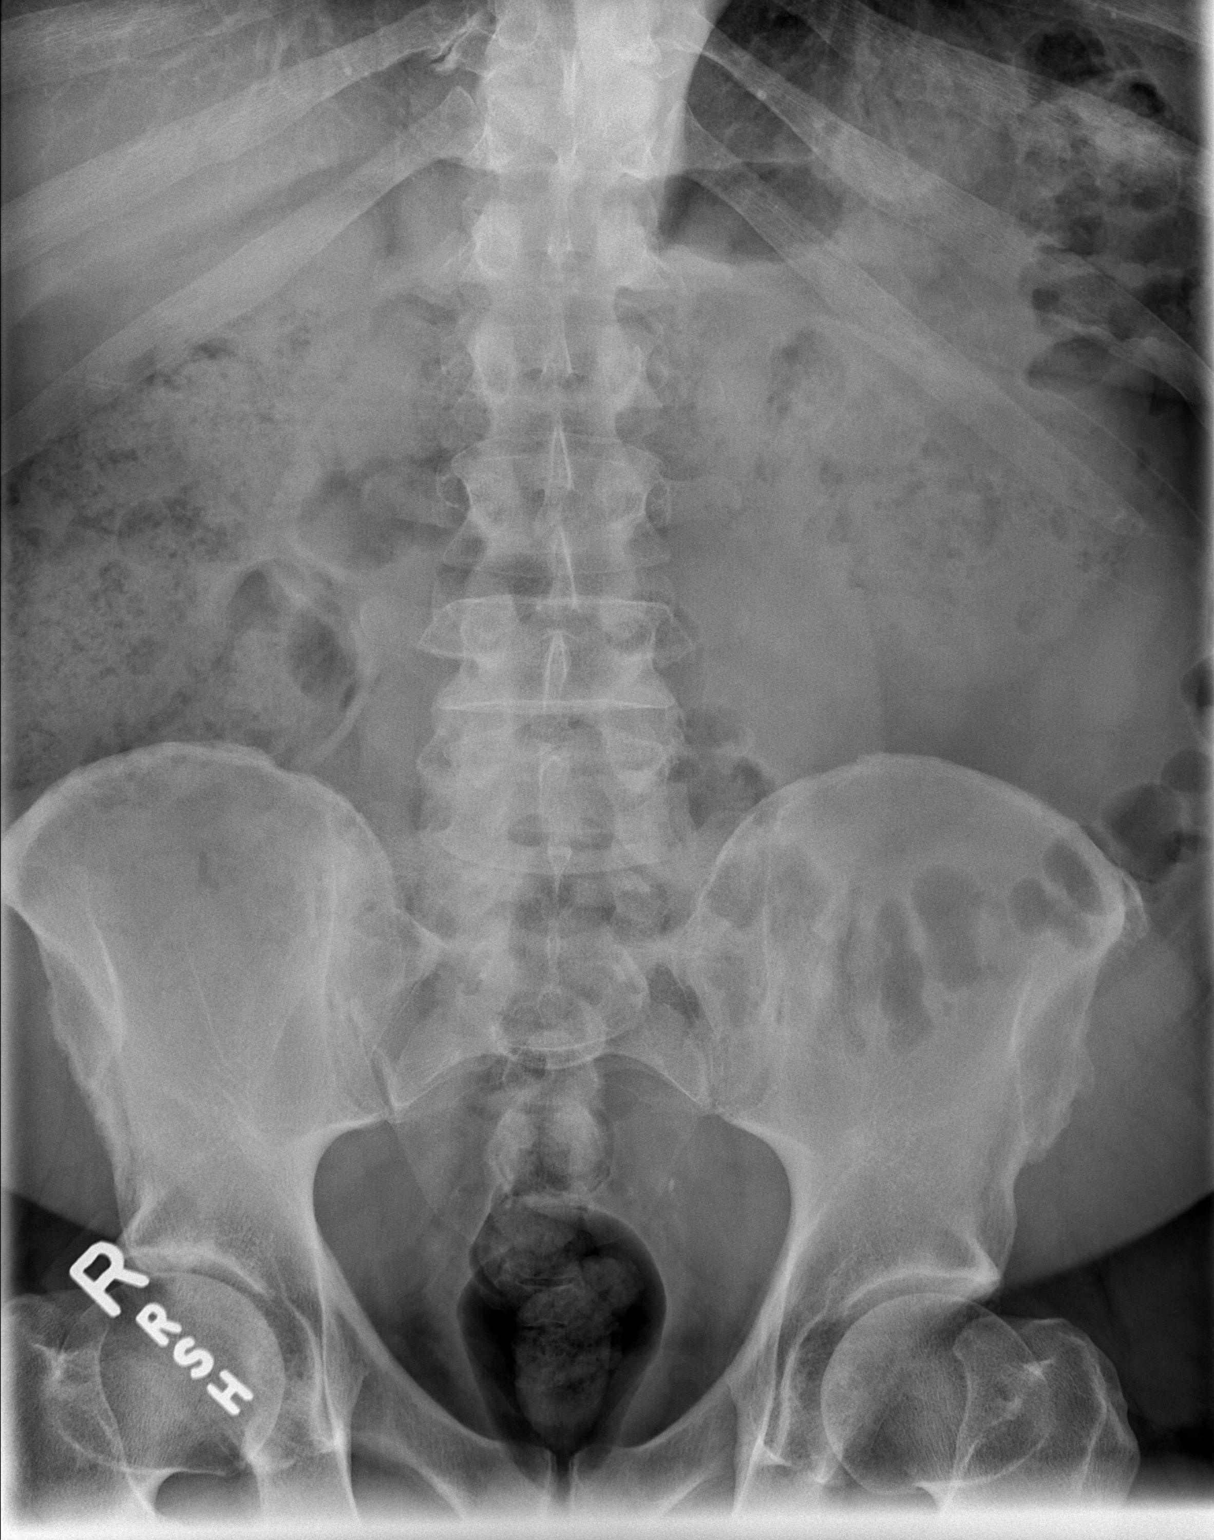

[1 of 1 positions shown; findings below may reference images not displayed]

FINDINGS: Moderate stool in the right colon and transverse colon. Small amount
of stool in the rectum. No distended small bowel loops to suggest
obstruction. The soft tissue shadows of the abdomen are maintained.
The bony structures are intact.
IMPRESSION: Moderate stool in the right colon and transverse colon.

## 2022-04-10 ENCOUNTER — Other Ambulatory Visit (HOSPITAL_COMMUNITY): Payer: Self-pay | Admitting: Interventional Radiology

## 2022-04-10 DIAGNOSIS — I63232 Cerebral infarction due to unspecified occlusion or stenosis of left carotid arteries: Secondary | ICD-10-CM

## 2022-04-16 ENCOUNTER — Ambulatory Visit (HOSPITAL_COMMUNITY)
Admission: RE | Admit: 2022-04-16 | Discharge: 2022-04-16 | Disposition: A | Discharge status: Home / Self Care | Payer: Medicare Other | Source: Ambulatory Visit | Attending: Interventional Radiology | Admitting: Interventional Radiology

## 2022-04-16 DIAGNOSIS — I63232 Cerebral infarction due to unspecified occlusion or stenosis of left carotid arteries: Secondary | ICD-10-CM

## 2022-04-21 ENCOUNTER — Telehealth (HOSPITAL_COMMUNITY): Payer: Self-pay

## 2022-04-21 NOTE — Telephone Encounter (Signed)
Called pt regarding recent imaging, no answer, left vm. AB  

## 2022-04-22 ENCOUNTER — Telehealth (HOSPITAL_COMMUNITY): Payer: Self-pay

## 2022-04-22 NOTE — Telephone Encounter (Signed)
Pt agreed to f/u in 6 months with a us carotid. AB  

## 2022-05-28 DIAGNOSIS — R7303 Prediabetes: Secondary | ICD-10-CM | POA: Diagnosis not present

## 2022-05-28 DIAGNOSIS — G4733 Obstructive sleep apnea (adult) (pediatric): Secondary | ICD-10-CM | POA: Diagnosis not present

## 2022-05-28 DIAGNOSIS — E7800 Pure hypercholesterolemia, unspecified: Secondary | ICD-10-CM | POA: Diagnosis not present

## 2022-05-28 DIAGNOSIS — I10 Essential (primary) hypertension: Secondary | ICD-10-CM | POA: Diagnosis not present

## 2022-05-28 DIAGNOSIS — R7309 Other abnormal glucose: Secondary | ICD-10-CM | POA: Diagnosis not present

## 2022-05-28 DIAGNOSIS — I63232 Cerebral infarction due to unspecified occlusion or stenosis of left carotid arteries: Secondary | ICD-10-CM | POA: Diagnosis not present

## 2022-08-20 DIAGNOSIS — H4321 Crystalline deposits in vitreous body, right eye: Secondary | ICD-10-CM | POA: Diagnosis not present

## 2022-08-20 DIAGNOSIS — Z961 Presence of intraocular lens: Secondary | ICD-10-CM | POA: Diagnosis not present

## 2022-10-28 ENCOUNTER — Other Ambulatory Visit (HOSPITAL_COMMUNITY): Payer: Self-pay | Admitting: Interventional Radiology

## 2022-10-28 DIAGNOSIS — I63232 Cerebral infarction due to unspecified occlusion or stenosis of left carotid arteries: Secondary | ICD-10-CM

## 2022-11-05 ENCOUNTER — Ambulatory Visit (HOSPITAL_COMMUNITY)
Admission: RE | Admit: 2022-11-05 | Discharge: 2022-11-05 | Disposition: A | Discharge status: Home / Self Care | Payer: Medicare Other | Source: Ambulatory Visit | Attending: Interventional Radiology | Admitting: Interventional Radiology

## 2022-11-05 DIAGNOSIS — I63232 Cerebral infarction due to unspecified occlusion or stenosis of left carotid arteries: Secondary | ICD-10-CM | POA: Diagnosis not present

## 2022-11-06 ENCOUNTER — Telehealth (HOSPITAL_COMMUNITY): Payer: Self-pay

## 2022-11-06 NOTE — Telephone Encounter (Signed)
Pt agreed to f/u in 6 months with a us carotid. AB  

## 2022-12-31 DIAGNOSIS — Z1211 Encounter for screening for malignant neoplasm of colon: Secondary | ICD-10-CM | POA: Diagnosis not present

## 2022-12-31 DIAGNOSIS — E7800 Pure hypercholesterolemia, unspecified: Secondary | ICD-10-CM | POA: Diagnosis not present

## 2022-12-31 DIAGNOSIS — I10 Essential (primary) hypertension: Secondary | ICD-10-CM | POA: Diagnosis not present

## 2022-12-31 DIAGNOSIS — R7303 Prediabetes: Secondary | ICD-10-CM | POA: Diagnosis not present

## 2022-12-31 DIAGNOSIS — Z23 Encounter for immunization: Secondary | ICD-10-CM | POA: Diagnosis not present

## 2022-12-31 DIAGNOSIS — G4733 Obstructive sleep apnea (adult) (pediatric): Secondary | ICD-10-CM | POA: Diagnosis not present

## 2022-12-31 DIAGNOSIS — I63232 Cerebral infarction due to unspecified occlusion or stenosis of left carotid arteries: Secondary | ICD-10-CM | POA: Diagnosis not present

## 2022-12-31 DIAGNOSIS — Z125 Encounter for screening for malignant neoplasm of prostate: Secondary | ICD-10-CM | POA: Diagnosis not present

## 2022-12-31 DIAGNOSIS — Z0000 Encounter for general adult medical examination without abnormal findings: Secondary | ICD-10-CM | POA: Diagnosis not present

## 2023-02-17 DIAGNOSIS — Z860101 Personal history of adenomatous and serrated colon polyps: Secondary | ICD-10-CM | POA: Diagnosis not present

## 2023-02-17 DIAGNOSIS — Z09 Encounter for follow-up examination after completed treatment for conditions other than malignant neoplasm: Secondary | ICD-10-CM | POA: Diagnosis not present

## 2023-07-01 DIAGNOSIS — I63232 Cerebral infarction due to unspecified occlusion or stenosis of left carotid arteries: Secondary | ICD-10-CM | POA: Diagnosis not present

## 2023-07-01 DIAGNOSIS — G4733 Obstructive sleep apnea (adult) (pediatric): Secondary | ICD-10-CM | POA: Diagnosis not present

## 2023-07-01 DIAGNOSIS — E7800 Pure hypercholesterolemia, unspecified: Secondary | ICD-10-CM | POA: Diagnosis not present

## 2023-07-01 DIAGNOSIS — R7303 Prediabetes: Secondary | ICD-10-CM | POA: Diagnosis not present

## 2023-07-01 DIAGNOSIS — I10 Essential (primary) hypertension: Secondary | ICD-10-CM | POA: Diagnosis not present

## 2023-09-12 ENCOUNTER — Encounter (HOSPITAL_COMMUNITY): Payer: Self-pay | Admitting: Interventional Radiology

## 2023-10-22 ENCOUNTER — Other Ambulatory Visit (HOSPITAL_COMMUNITY): Payer: Self-pay | Admitting: Radiology

## 2023-10-22 DIAGNOSIS — I63512 Cerebral infarction due to unspecified occlusion or stenosis of left middle cerebral artery: Secondary | ICD-10-CM

## 2023-10-22 DIAGNOSIS — I6602 Occlusion and stenosis of left middle cerebral artery: Secondary | ICD-10-CM

## 2023-10-28 ENCOUNTER — Ambulatory Visit (INDEPENDENT_AMBULATORY_CARE_PROVIDER_SITE_OTHER): Payer: PRIVATE HEALTH INSURANCE | Admitting: Neuroradiology

## 2023-10-28 ENCOUNTER — Encounter: Payer: Self-pay | Admitting: Neuroradiology

## 2023-10-28 VITALS — BP 153/90 | HR 60 | Ht 69.0 in | Wt 260.0 lb

## 2023-10-28 DIAGNOSIS — Z7982 Long term (current) use of aspirin: Secondary | ICD-10-CM

## 2023-10-28 DIAGNOSIS — I6522 Occlusion and stenosis of left carotid artery: Secondary | ICD-10-CM | POA: Diagnosis not present

## 2023-10-28 NOTE — Progress Notes (Signed)
 Kevin Reynolds had a tandem occlusion stroke with left ICA stent emergently placed by Dr. Monna in 2021.  He had an US  in 11/05/22 which looked good. I reviewed that study.  No further symptoms of stroke or TIA.  I talked with him about the expected course after a stent, and the fact that restenosis occurs in the first 6-12 months.  He is anxious given his previous experience.    He is on aspirin  81.  We will check one more US , and if this shows no stenosis, no further routine follow up needed.  Continue aspirin  long term.

## 2023-10-29 ENCOUNTER — Other Ambulatory Visit: Payer: Self-pay

## 2023-10-29 DIAGNOSIS — I6522 Occlusion and stenosis of left carotid artery: Secondary | ICD-10-CM

## 2023-11-02 ENCOUNTER — Ambulatory Visit (HOSPITAL_COMMUNITY)
Admission: RE | Admit: 2023-11-02 | Discharge: 2023-11-02 | Disposition: A | Discharge status: Home / Self Care | Source: Ambulatory Visit | Attending: Neuroradiology | Admitting: Neuroradiology

## 2023-11-02 DIAGNOSIS — I6522 Occlusion and stenosis of left carotid artery: Secondary | ICD-10-CM | POA: Insufficient documentation

## 2023-11-17 DIAGNOSIS — H35372 Puckering of macula, left eye: Secondary | ICD-10-CM | POA: Diagnosis not present

## 2023-11-17 DIAGNOSIS — Z961 Presence of intraocular lens: Secondary | ICD-10-CM | POA: Diagnosis not present

## 2023-11-17 DIAGNOSIS — H4321 Crystalline deposits in vitreous body, right eye: Secondary | ICD-10-CM | POA: Diagnosis not present

## 2023-12-02 ENCOUNTER — Telehealth: Payer: Self-pay | Admitting: Neuroradiology

## 2023-12-02 NOTE — Telephone Encounter (Signed)
 Patient called to follow up from test done 11/02/23 that Dr Lester sent him for and wanted to know if anything else needs to be done or if he needs to follow up. He said he never heard anything after the test was done. Call back is [phone].

## 2023-12-03 NOTE — Telephone Encounter (Signed)
Patient scheduled for next Wednesday.

## 2023-12-09 ENCOUNTER — Telehealth: Admitting: Neuroradiology

## 2023-12-17 ENCOUNTER — Telehealth: Payer: Self-pay | Admitting: Neuroradiology

## 2023-12-17 NOTE — Telephone Encounter (Signed)
 We spoke about his ultrasound which showed that there was no restenosis of the left carotid stent and the right side also showed no stenosis.  As it has been 4 years since his stent placement, I do not think further routine follow-up is needed.

## 2024-01-28 DIAGNOSIS — Z8673 Personal history of transient ischemic attack (TIA), and cerebral infarction without residual deficits: Secondary | ICD-10-CM | POA: Diagnosis not present

## 2024-01-28 DIAGNOSIS — Z1331 Encounter for screening for depression: Secondary | ICD-10-CM | POA: Diagnosis not present

## 2024-01-28 DIAGNOSIS — Z0000 Encounter for general adult medical examination without abnormal findings: Secondary | ICD-10-CM | POA: Diagnosis not present

## 2024-01-28 DIAGNOSIS — E7800 Pure hypercholesterolemia, unspecified: Secondary | ICD-10-CM | POA: Diagnosis not present

## 2024-01-28 DIAGNOSIS — Z23 Encounter for immunization: Secondary | ICD-10-CM | POA: Diagnosis not present

## 2024-01-28 DIAGNOSIS — R7303 Prediabetes: Secondary | ICD-10-CM | POA: Diagnosis not present

## 2024-01-28 DIAGNOSIS — I10 Essential (primary) hypertension: Secondary | ICD-10-CM | POA: Diagnosis not present

## 2024-01-28 DIAGNOSIS — Z125 Encounter for screening for malignant neoplasm of prostate: Secondary | ICD-10-CM | POA: Diagnosis not present

## 2024-01-28 DIAGNOSIS — G4733 Obstructive sleep apnea (adult) (pediatric): Secondary | ICD-10-CM | POA: Diagnosis not present
